# Patient Record
Sex: Male | Born: 1960 | Hispanic: Yes | Marital: Married | Smoking: Never smoker
Health system: Southern US, Community
[De-identification: ages and names within clinical notes are randomized; demographics above are authoritative.]

## PROBLEM LIST (undated history)

## (undated) DIAGNOSIS — I10 Essential (primary) hypertension: Secondary | ICD-10-CM

## (undated) DIAGNOSIS — E119 Type 2 diabetes mellitus without complications: Secondary | ICD-10-CM

## (undated) DIAGNOSIS — E7800 Pure hypercholesterolemia, unspecified: Secondary | ICD-10-CM

## (undated) HISTORY — PX: NO PAST SURGERIES: SHX2092

---

## 2002-12-06 ENCOUNTER — Encounter: Payer: Self-pay | Admitting: *Deleted

## 2002-12-06 ENCOUNTER — Emergency Department (HOSPITAL_COMMUNITY): Admission: EM | Admit: 2002-12-06 | Discharge: 2002-12-06 | Payer: Self-pay | Admitting: *Deleted

## 2008-03-02 ENCOUNTER — Emergency Department (HOSPITAL_COMMUNITY): Admission: EM | Admit: 2008-03-02 | Discharge: 2008-03-02 | Payer: Self-pay | Admitting: Emergency Medicine

## 2011-04-22 LAB — COMPREHENSIVE METABOLIC PANEL
ALT: 41
Alkaline Phosphatase: 78
CO2: 26
Calcium: 8.7
GFR calc non Af Amer: >60
Glucose, Bld: 175 — ABNORMAL HIGH
Sodium: 131 — ABNORMAL LOW
Total Bilirubin: 0.9

## 2011-04-22 LAB — URINE MICROSCOPIC-ADD ON

## 2011-04-22 LAB — CBC
Hemoglobin: 15.5
MCHC: 34.3
RBC: 5.12

## 2011-04-22 LAB — URINALYSIS, ROUTINE W REFLEX MICROSCOPIC
Bilirubin Urine: NEGATIVE
Ketones, ur: NEGATIVE
Nitrite: NEGATIVE
Urobilinogen, UA: 0.2
pH: 6.5

## 2011-04-22 LAB — DIFFERENTIAL
Basophils Absolute: 0
Basophils Relative: 0
Eosinophils Absolute: 0
Neutro Abs: 9.1 — ABNORMAL HIGH
Neutrophils Relative %: 83 — ABNORMAL HIGH

## 2015-03-13 ENCOUNTER — Telehealth: Payer: Self-pay

## 2015-03-13 NOTE — Telephone Encounter (Signed)
Rosa called for patient patient to set him up for his colonoscopy. Please call [phone] and ask for [facility] (interpreter)

## 2015-03-23 NOTE — Telephone Encounter (Signed)
LMOM to call.

## 2015-03-24 ENCOUNTER — Other Ambulatory Visit: Payer: Self-pay

## 2015-03-24 ENCOUNTER — Telehealth: Payer: Self-pay

## 2015-03-24 DIAGNOSIS — Z1211 Encounter for screening for malignant neoplasm of colon: Secondary | ICD-10-CM

## 2015-03-24 NOTE — Telephone Encounter (Signed)
SUPREP SPLIT DOSING-REGULAR breakfast then CLEAR LIQUIDS after 9 am.  CONTINUE GLUCOPHAGE. HOLD lantus ON THE MORNING OF TCS.

## 2015-03-24 NOTE — Telephone Encounter (Signed)
Gastroenterology Pre-Procedure Review  Request Date: 03/24/2015 Requesting Physician: Dwyane Luo, PA  PATIENT REVIEW QUESTIONS: The patient responded to the following health history questions as indicated:    1. Diabetes Melitis: YES 2. Joint replacements in the past 12 months: no 3. Major health problems in the past 3 months: no 4. Has an artificial valve or MVP: no 5. Has a defibrillator: no 6. Has been advised in past to take antibiotics in advance of a procedure like teeth cleaning: no    MEDICATIONS & ALLERGIES:    Patient reports the following regarding taking any blood thinners:   Plavix? no Aspirin? YES Coumadin? no  Patient confirms/reports the following medications:  Current Outpatient Prescriptions  Medication Sig Dispense Refill  . aspirin 81 MG tablet Take 81 mg by mouth daily.    . insulin glargine (LANTUS) 100 UNIT/ML injection Inject 15 Units into the skin daily. Takes in the AM    . metFORMIN (GLUMETZA) 500 MG (MOD) 24 hr tablet Take 500 mg by mouth at bedtime.     No current facility-administered medications for this visit.    Patient confirms/reports the following allergies:  No Known Allergies  No orders of the defined types were placed in this encounter.    AUTHORIZATION INFORMATION Primary Insurance:   ID #:  Group #:  Pre-Cert / Auth required: Pre-Cert / Auth #:   Secondary Insurance:   ID #:   Group #:  Pre-Cert / Auth required:  Pre-Cert / Auth #:   SCHEDULE INFORMATION: Procedure has been scheduled as follows:  Date: 04/24/2015                  Time: 10:30 AM  Location: [facility] Short Stay  This Gastroenterology Pre-Precedure Review Form is being routed to the following provider(s): Jonette Eva, MD

## 2015-03-24 NOTE — Telephone Encounter (Signed)
See separate triage.  

## 2015-03-25 MED ORDER — NA SULFATE-K SULFATE-MG SULF 17.5-3.13-1.6 GM/177ML PO SOLN: 1.0000 | ORAL | Status: DC

## 2015-03-25 NOTE — Telephone Encounter (Signed)
Rx sent to the pharmacy and instructions mailed to pt.  Instructions for the lantus and glucophage handwritten on the instruction sheet.  On morning of prep hold lantus and continue glucophage.

## 2015-04-06 ENCOUNTER — Telehealth: Payer: Self-pay

## 2015-04-06 NOTE — Telephone Encounter (Signed)
T/C from [place], daughter, to reschedule colonoscopy from 04/24/2015 to 04/27/2015 at 10:15 AM.  I am mailing new instructions. LMOM for Timothy Townsend of the new appt.

## 2015-04-06 NOTE — Telephone Encounter (Signed)
I called [employer] @ [phone] and spoke to Onnie Graham who said a PA is not required for a screening colonoscopy as out patient at [facility].

## 2015-04-22 ENCOUNTER — Telehealth: Payer: Self-pay

## 2015-04-22 NOTE — Telephone Encounter (Signed)
PLEASE CALL PATIENT DAUGHTER [phone]   HAS QUESTION ABOUT PREP KIT

## 2015-04-22 NOTE — Telephone Encounter (Signed)
I spoke to daughter, Clotilde Dieter. She asked for a sample of the Suprep, it would cost them $98.00.  I am leaving a sample at the front for pick up with the instructions also.

## 2015-04-22 NOTE — Telephone Encounter (Signed)
LMOM to call.

## 2015-04-27 ENCOUNTER — Ambulatory Visit (HOSPITAL_COMMUNITY)
Admission: RE | Admit: 2015-04-27 | Discharge: 2015-04-27 | Disposition: A | Discharge status: Home / Self Care | Payer: BLUE CROSS/BLUE SHIELD | Source: Ambulatory Visit | Attending: Gastroenterology | Admitting: Gastroenterology

## 2015-04-27 ENCOUNTER — Encounter (HOSPITAL_COMMUNITY): Payer: Self-pay | Admitting: *Deleted

## 2015-04-27 ENCOUNTER — Encounter (HOSPITAL_COMMUNITY)
Admission: RE | Disposition: A | Discharge status: Home / Self Care | Payer: Self-pay | Source: Ambulatory Visit | Attending: Gastroenterology

## 2015-04-27 DIAGNOSIS — Q438 Other specified congenital malformations of intestine: Secondary | ICD-10-CM | POA: Insufficient documentation

## 2015-04-27 DIAGNOSIS — K644 Residual hemorrhoidal skin tags: Secondary | ICD-10-CM | POA: Diagnosis not present

## 2015-04-27 DIAGNOSIS — Z7984 Long term (current) use of oral hypoglycemic drugs: Secondary | ICD-10-CM | POA: Insufficient documentation

## 2015-04-27 DIAGNOSIS — E119 Type 2 diabetes mellitus without complications: Secondary | ICD-10-CM | POA: Diagnosis not present

## 2015-04-27 DIAGNOSIS — Z1211 Encounter for screening for malignant neoplasm of colon: Secondary | ICD-10-CM | POA: Diagnosis not present

## 2015-04-27 DIAGNOSIS — K648 Other hemorrhoids: Secondary | ICD-10-CM | POA: Insufficient documentation

## 2015-04-27 DIAGNOSIS — K5730 Diverticulosis of large intestine without perforation or abscess without bleeding: Secondary | ICD-10-CM | POA: Insufficient documentation

## 2015-04-27 DIAGNOSIS — Z794 Long term (current) use of insulin: Secondary | ICD-10-CM | POA: Diagnosis not present

## 2015-04-27 DIAGNOSIS — K621 Rectal polyp: Secondary | ICD-10-CM | POA: Diagnosis not present

## 2015-04-27 DIAGNOSIS — Z7982 Long term (current) use of aspirin: Secondary | ICD-10-CM | POA: Insufficient documentation

## 2015-04-27 HISTORY — DX: Type 2 diabetes mellitus without complications: E11.9

## 2015-04-27 HISTORY — PX: COLONOSCOPY: SHX5424

## 2015-04-27 LAB — GLUCOSE, CAPILLARY: Glucose-Capillary: 169 mg/dL — ABNORMAL HIGH (ref 65–99)

## 2015-04-27 SURGERY — COLONOSCOPY
Anesthesia: Moderate Sedation

## 2015-04-27 MED ORDER — SODIUM CHLORIDE 0.9 % IV SOLN
INTRAVENOUS | Status: DC
  Administered 2015-04-27: 10:00:00 via INTRAVENOUS

## 2015-04-27 MED ORDER — MEPERIDINE HCL 100 MG/ML IJ SOLN
INTRAMUSCULAR | Status: AC
  Filled 2015-04-27: qty 2

## 2015-04-27 MED ORDER — MIDAZOLAM HCL 5 MG/5ML IJ SOLN
INTRAMUSCULAR | Status: DC | PRN
  Administered 2015-04-27 (×2): 2 mg via INTRAVENOUS
  Administered 2015-04-27: 1 mg via INTRAVENOUS

## 2015-04-27 MED ORDER — MEPERIDINE HCL 100 MG/ML IJ SOLN
INTRAMUSCULAR | Status: DC | PRN
  Administered 2015-04-27 (×3): 25 mg via INTRAVENOUS

## 2015-04-27 MED ORDER — MIDAZOLAM HCL 5 MG/5ML IJ SOLN
INTRAMUSCULAR | Status: AC
  Filled 2015-04-27: qty 10

## 2015-04-27 NOTE — Discharge Instructions (Signed)
You have internal AND EXTERNAL hemorrhoids AND DIVERTICULOSIS IN YOUR LEFT COLON. YOU DID NOT HAVE ANY POLYPS. I BIOPSIED A NODULE IN YOUR RECTUM.   FOLLOW A Dieta con alto contenido de [place]. AVOID ITEMS THAT CAUSE BLOATING. SEE INFO BELOW.  Next Colonoscopa in 10 anos.  Colonoscopa: cuidados posteriores (Colonoscopy, Care After) Estas indicaciones le proporcionan informacin general acerca de cmo deber cuidarse despus del procedimiento. El mdico tambin podr darle instrucciones especficas. Comunquese con el mdico si tiene algn problema o tiene preguntas despus del procedimiento. CUIDADOS EN EL HOGAR  No conduzca durante 24horas.  No firme papeles importantes ni use maquinaria pesada durante 24horas.  Puede ducharse.  Puede retomar las actividades habituales, pero hgalo ms despacio durante las primeras 24horas.  Durante las primeras 24horas, descanse con frecuencia.  Camine o pngase compresas tibias en el vientre (abdomen) si tiene clicos intestinales o gases.  Beba suficiente lquido para mantener el pis (orina) claro o de color amarillo plido.  Retome su dieta normal. No coma comidas pesadas ni fritas.  No tome alcohol durante 24horas, o segn el mdico le indique.  Tome solo los medicamentos segn le haya indicado el mdico. Si se obtuvo una muestra de tejido (biopsia) durante el procedimiento:   No tome aspirina ni anticoagulantes durante 7das, o segn el mdico le indique.  No tome alcohol durante 7das, o segn el mdico le indique.  Consuma alimentos livianos durante las primeras 24horas. SOLICITE AYUDA SI: An hay una pequea cantidad de sangre en la materia fecal (heces) 2 o 3das despus del procedimiento. SOLICITE AYUDA DE INMEDIATO SI:  Hay ms que una pequea cantidad de [employer] materia fecal.  Observa grumos de tejido (cogulos de [place]) en la materia fecal.  Tiene el vientre inflamado (hinchado).  Tiene malestar  estomacal (nuseas) o vomita.  Tiene fiebre.  Siente que Chief Technology Officer en el vientre empeora y no se alivia con los medicamentos.   Dieta con alto contenido de fibra  (High [employer] Diet)  La fibra se encuentra en frutas, verduras y granos. Una dieta con alto contenido en fibras se favorece con la adicin de ms granos enteros, legumbres, frutas y verduras en su dieta. La cantidad recomendada de fibra para los hombres adultos es de 38 g por da. Para las mujeres adultas es de 25 g por da. Las [employer] y las que amamantan deben consumir 28 gramos de fibra por Futures trader. Si usted tiene un problema digestivo o intestinal, consulte a su mdico antes de la adicin de alimentos ricos en fibra a su dieta. Coma una variedad de alimentos ricos en fibra en lugar de slo unos pocos.  OBJETIVO  Aumentar la masa fecal.  Tener deposiciones ms regulares para evitar el estreimiento.  Reducir el colesterol.  Para evitar comer en exceso. CUANDO SE UTILIZA ESTA DIETA?  En caso de estreimiento y hemorroides.  En caso de diverticulosis no complicada (enfermedad intestinal) y en el sndrome del colon irritable.  Si necesita ayuda para el control de [place].  Si desea mejorar su dieta como medida de proteccin contra la aterosclerosis, la diabetes y Management consultant. FUENTES DE FIBRA  Panes y cereales integrales.  Frutas, como las [place], [place], pltanos, fresas, Environmental manager y peras.  Verduras, como guisantes, zanahorias, batatas, remolachas, brcoli, repollo, espinacas y alcauciles.  Legumbres, las arvejas, soja, lentejas.  Almendras. CONTENIDO DE FIBRA DE LOS ALIMENTOS  Almidones y granos / Fibra Diettica (g)  Cheerios, 1 taza / 3 g  Corn Flakes, 1 taza /  0,7 g  Arroz inflado, 1  tazas / 0,3 g  Harina de avena instantnea (cocida),  taza / 2 g  Cereal de trigo escarchado, 1 taza / 5,1 g  Arroz marrn grano largo (cocido), 1 taza / 3,5 g  Arroz blanco grano largo (cocido), 1 taza / 0,6 g  Macarrones  enriquecidos (cocidos), 1 taza / 2,5 g Legumbres / Fibra Diettica (g)  Frijoles cocidos (enlatados, crudos o vegetarianos),  taza / 5,2 g  Frijoles (enlatados),  taza / 6,8 g  Frijoles pintos (cocidos),  taza / 5,5 g Panes y Gaffer / Media planner (g)  Galletas de graham o miel, 2 plazas / 0,7 g  Galletitas saladas, 3 unidades / 0,3 g  Pretzels salados comunes, 10 pedazos / 1,8 g  Pan integral, 1 rebanada / 1,9 g  Pan blanco, 1 rebanada / 0,7 g  Pan con pasas, 1 rebanada / 1,2 g  Bagel 3 oz / 2 g  Tortilla de harina, 1 oz / 0.9 g  Tortilla de maz, 1 pequea / 1,5 g  Pan de amburguesa o hot dog, 1 pequeo / 0,9 g Frutas / Fibra Diettica (g)  Manzana con piel, 1 mediana / 4,4 g  Pur de [employer],  taza / 1,5 g  Pltano,  mediano / 1,5 g  Uvas, 10 uvas / 0,4 g  Naranja, 1 pequea / 2,3 g  Pasas, 1,5 oz / 1.6 g  Meln, 1 taza / 1,4 g Vegetales / Fibra Diettica (g)  Judas verdes (en conserva),  taza / 1,3 g  Zanahorias (cocido),  taza / 2,3 g  Broccoli (cocido),  taza / 2,8 g  Guisantes (cocidos),  taza / 4,4 g  Pur de papas,  taza / 1,6 g  Lechuga, 1 taza / 0,5 g  Maz (en lata),  taza / 1,6 g  Tomate,  taza / 1,1 g 1 cup / 3 g.

## 2015-04-27 NOTE — H&P (Signed)
  Primary Care Physician:  Purvis Kilts, MD Primary Gastroenterologist:  Dr. Oneida Alar  Pre-Procedure History & Physical: HPI:  Timothy Townsend is a 54 y.o. male here for [place].  Past Medical History  Diagnosis Date  . Diabetes mellitus without complication [facility])     Past Surgical History  Procedure Laterality Date  . No past surgeries      Prior to Admission medications   Medication Sig Start Date End Date Taking? Authorizing Provider  aspirin 81 MG tablet Take 81 mg by mouth daily.   Yes Historical Provider, MD  insulin glargine (LANTUS) 100 UNIT/ML injection Inject 15 Units into the skin daily. Takes in the AM   Yes Historical Provider, MD  metFORMIN (GLUMETZA) 500 MG (MOD) 24 hr tablet Take 500 mg by mouth at bedtime.   Yes Historical Provider, MD  Na Sulfate-K Sulfate-Mg Sulf (SUPREP BOWEL PREP) SOLN Take 1 kit by mouth as directed. 03/25/15  Yes Danie Binder, MD    Allergies as of 03/24/2015  . (No Known Allergies)    History reviewed. No pertinent family history.  Social History   Social History  . Marital Status: Married    Spouse Name: N/A  . Number of Children: N/A  . Years of Education: N/A   Occupational History  . Not on file.   Social History Main Topics  . Smoking status: Never Smoker   . Smokeless tobacco: Not on file  . Alcohol Use: No  . Drug Use: No  . Sexual Activity: Not on file   Other Topics Concern  . Not on file   Social History Narrative  . No narrative on file    Review of Systems: See HPI, otherwise negative ROS   Physical Exam: BP 125/78 mmHg  Pulse 70  Temp(Src) 98.6 F (37 C) (Oral)  Resp 22  Ht _0  (1.702 m)  Wt 217 lb (98.431 kg)  BMI 33.98 kg/m2  SpO2 98% General:   Alert,  pleasant and cooperative in NAD Head:  Normocephalic and atraumatic. Neck:  Supple; Lungs:  Clear throughout to auscultation.    Heart:  Regular rate and rhythm. Abdomen:  Soft, nontender and nondistended. Normal bowel  sounds, without guarding, and without rebound.   Neurologic:  Alert and  oriented x4;  grossly normal neurologically.  Impression/Plan:     SCREENING  Plan:  1. TCS TODAY

## 2015-04-27 NOTE — Op Note (Signed)
[facility] [address] [place], [address]   COLONOSCOPY PROCEDURE REPORT  PATIENT: Timothy Townsend, Timothy Townsend  MR#: [id number] BIRTHDATE: 08-26-1960 , 54  yrs. old GENDER: male ENDOSCOPIST: West Bali, MD REFERRED XB:JYNW Phillips Odor, M.D. PROCEDURE DATE:  2015-04-29 PROCEDURE:   Colonoscopy with biopsy INDICATIONS:average risk patient for colon cancer. MEDICATIONS: Demerol 75 mg IV and Versed 5 mg IV  DESCRIPTION OF PROCEDURE:    Physical exam was performed.  Informed consent was obtained from the patient after explaining the benefits, risks, and alternatives to procedure.  The patient was connected to monitor and placed in left lateral position. Continuous oxygen was provided by nasal cannula and IV medicine administered through an indwelling cannula.  After administration of sedation and rectal exam, the patients rectum was intubated and the EC-[id number] ([id number])  colonoscope was advanced under direct visualization to the ileum.  The scope was removed slowly by carefully examining the color, texture, anatomy, and integrity mucosa on the way out.  The patient was recovered in endoscopy and discharged home in satisfactory condition. Estimated blood loss is zero unless otherwise noted in this procedure report.    COLON FINDINGS: The examined terminal ileum appeared to be normal. , There was moderate diverticulosis noted in the sigmoid colon and descending colon with associated muscular hypertrophy and tortuosity.  , Small internal hemorrhoids were found.  , Moderate sized external hemorrhoids were found.  , and YELLOW NODULE IN RECTUM BIOPSIED VIA COLD FORCEPS.  PREP QUALITY: excellent.  CECAL W/D TIME: 16       minutes COMPLICATIONS: None  ENDOSCOPIC IMPRESSION: 1.   RECTAL NODULE MOST LIKEY A LIPOMA 2.   Moderate diverticulosis in the sigmoid colon and descending colon 3.   Small internal hemorrhoids 4.   Moderate sized external hemorrhoids 5.   SLIGHTLY  REDUNDANT LEFT COLON  RECOMMENDATIONS: AWAIT BIOPSY HIGH FIBET DIET NEXT TCS IN 10 YEARS    _______________________________ eSignedWest Bali, MD 04/29/2015 11:23 AM   CPT CODES: ICD CODES:  The ICD and CPT codes recommended by this software are interpretations from the data that the clinical staff has captured with the software.  The verification of the translation of this report to the ICD and CPT codes and modifiers is the sole responsibility of the health care institution and practicing physician where this report was generated.  [employer] will not be held responsible for the validity of the ICD and CPT codes included on this report.  AMA assumes no liability for data contained or not contained herein. CPT is a Publishing rights manager of the [employer].

## 2015-04-29 ENCOUNTER — Telehealth: Payer: Self-pay | Admitting: Gastroenterology

## 2015-04-29 ENCOUNTER — Encounter (HOSPITAL_COMMUNITY): Payer: Self-pay | Admitting: Gastroenterology

## 2015-04-29 ENCOUNTER — Other Ambulatory Visit: Payer: Self-pay

## 2015-04-29 DIAGNOSIS — K6289 Other specified diseases of anus and rectum: Secondary | ICD-10-CM

## 2015-04-29 NOTE — Telephone Encounter (Signed)
ON RECALL  °

## 2015-04-29 NOTE — Telephone Encounter (Signed)
Called pt and was unable to reach him on his number.LMOM of patients daughter Timothy Townsend. Told her to call office back for some results

## 2015-04-29 NOTE — Telephone Encounter (Signed)
PLEASE CALL PT. HE HAS A SUBMUCOSAL LESION IN HIS RECTUM. THE LINING OF THE RECTUM LOOKS NORMAL BUT HE NEEDS A RECTAL U/S TO MAKE SURE IT IS BENIGN UNDERNEATH. WE WILL REFER HIM TO DR. Dulce Sellar FOR RECTAL ULTRASOUND, Dx: RECTAL NODULE EVALUATE FOR CARCINOID. FOLLOW A HIGH FIBER DIET. NEXT TCS IN 10 YEARS.

## 2015-04-30 NOTE — Telephone Encounter (Signed)
Rosa pt daughter called back and she is aware of the referral  To Dr. Dulce Sellar

## 2015-06-17 ENCOUNTER — Other Ambulatory Visit: Payer: Self-pay | Admitting: Gastroenterology

## 2015-06-22 ENCOUNTER — Other Ambulatory Visit: Payer: Self-pay | Admitting: Gastroenterology

## 2015-06-22 NOTE — Addendum Note (Signed)
Addended by: Willis ModenaUTLAW, Anastasios Melander on: 06/22/2015 08:03 AM   Modules accepted: Orders

## 2015-06-22 NOTE — Addendum Note (Signed)
Addended by: Arletha Marschke on: 06/22/2015 10:06 AM   Modules accepted: Orders  

## 2015-06-24 ENCOUNTER — Encounter (HOSPITAL_COMMUNITY): Admission: RE | Payer: Self-pay | Source: Ambulatory Visit

## 2015-06-24 ENCOUNTER — Ambulatory Visit (HOSPITAL_COMMUNITY)
Admission: RE | Admit: 2015-06-24 | Payer: BLUE CROSS/BLUE SHIELD | Source: Ambulatory Visit | Admitting: Gastroenterology

## 2015-06-24 SURGERY — ULTRASOUND, LOWER GI TRACT, ENDOSCOPIC
Anesthesia: Moderate Sedation

## 2016-03-25 DIAGNOSIS — Z6838 Body mass index (BMI) 38.0-38.9, adult: Secondary | ICD-10-CM | POA: Diagnosis not present

## 2016-03-25 DIAGNOSIS — E782 Mixed hyperlipidemia: Secondary | ICD-10-CM | POA: Diagnosis not present

## 2016-03-25 DIAGNOSIS — Z1389 Encounter for screening for other disorder: Secondary | ICD-10-CM | POA: Diagnosis not present

## 2016-03-25 DIAGNOSIS — R7989 Other specified abnormal findings of blood chemistry: Secondary | ICD-10-CM | POA: Diagnosis not present

## 2016-03-25 DIAGNOSIS — E1165 Type 2 diabetes mellitus with hyperglycemia: Secondary | ICD-10-CM | POA: Diagnosis not present

## 2016-03-25 DIAGNOSIS — I10 Essential (primary) hypertension: Secondary | ICD-10-CM | POA: Diagnosis not present

## 2016-07-08 DIAGNOSIS — E1165 Type 2 diabetes mellitus with hyperglycemia: Secondary | ICD-10-CM | POA: Diagnosis not present

## 2016-07-08 DIAGNOSIS — Z1389 Encounter for screening for other disorder: Secondary | ICD-10-CM | POA: Diagnosis not present

## 2016-07-08 DIAGNOSIS — I10 Essential (primary) hypertension: Secondary | ICD-10-CM | POA: Diagnosis not present

## 2016-07-08 DIAGNOSIS — E782 Mixed hyperlipidemia: Secondary | ICD-10-CM | POA: Diagnosis not present

## 2016-07-08 DIAGNOSIS — Z6839 Body mass index (BMI) 39.0-39.9, adult: Secondary | ICD-10-CM | POA: Diagnosis not present

## 2016-07-08 DIAGNOSIS — R7989 Other specified abnormal findings of blood chemistry: Secondary | ICD-10-CM | POA: Diagnosis not present

## 2016-07-08 DIAGNOSIS — Z23 Encounter for immunization: Secondary | ICD-10-CM | POA: Diagnosis not present

## 2016-08-16 DIAGNOSIS — R7989 Other specified abnormal findings of blood chemistry: Secondary | ICD-10-CM | POA: Diagnosis not present

## 2016-08-26 DIAGNOSIS — H35032 Hypertensive retinopathy, left eye: Secondary | ICD-10-CM | POA: Diagnosis not present

## 2016-08-26 DIAGNOSIS — H35031 Hypertensive retinopathy, right eye: Secondary | ICD-10-CM | POA: Diagnosis not present

## 2016-08-26 DIAGNOSIS — H2513 Age-related nuclear cataract, bilateral: Secondary | ICD-10-CM | POA: Diagnosis not present

## 2016-08-26 DIAGNOSIS — E119 Type 2 diabetes mellitus without complications: Secondary | ICD-10-CM | POA: Diagnosis not present

## 2016-08-26 DIAGNOSIS — H35033 Hypertensive retinopathy, bilateral: Secondary | ICD-10-CM | POA: Diagnosis not present

## 2016-08-26 DIAGNOSIS — H25013 Cortical age-related cataract, bilateral: Secondary | ICD-10-CM | POA: Diagnosis not present

## 2017-01-13 DIAGNOSIS — Z23 Encounter for immunization: Secondary | ICD-10-CM | POA: Diagnosis not present

## 2017-01-13 DIAGNOSIS — Z6837 Body mass index (BMI) 37.0-37.9, adult: Secondary | ICD-10-CM | POA: Diagnosis not present

## 2017-01-13 DIAGNOSIS — R7989 Other specified abnormal findings of blood chemistry: Secondary | ICD-10-CM | POA: Diagnosis not present

## 2017-01-13 DIAGNOSIS — G4730 Sleep apnea, unspecified: Secondary | ICD-10-CM | POA: Diagnosis not present

## 2017-01-13 DIAGNOSIS — E1165 Type 2 diabetes mellitus with hyperglycemia: Secondary | ICD-10-CM | POA: Diagnosis not present

## 2017-01-13 DIAGNOSIS — Z1389 Encounter for screening for other disorder: Secondary | ICD-10-CM | POA: Diagnosis not present

## 2017-01-13 DIAGNOSIS — I10 Essential (primary) hypertension: Secondary | ICD-10-CM | POA: Diagnosis not present

## 2017-01-13 DIAGNOSIS — E782 Mixed hyperlipidemia: Secondary | ICD-10-CM | POA: Diagnosis not present

## 2017-02-10 DIAGNOSIS — R7989 Other specified abnormal findings of blood chemistry: Secondary | ICD-10-CM | POA: Diagnosis not present

## 2017-03-17 DIAGNOSIS — R7989 Other specified abnormal findings of blood chemistry: Secondary | ICD-10-CM | POA: Diagnosis not present

## 2017-05-19 DIAGNOSIS — I10 Essential (primary) hypertension: Secondary | ICD-10-CM | POA: Diagnosis not present

## 2017-05-19 DIAGNOSIS — Z23 Encounter for immunization: Secondary | ICD-10-CM | POA: Diagnosis not present

## 2017-05-19 DIAGNOSIS — Z6838 Body mass index (BMI) 38.0-38.9, adult: Secondary | ICD-10-CM | POA: Diagnosis not present

## 2017-05-19 DIAGNOSIS — E782 Mixed hyperlipidemia: Secondary | ICD-10-CM | POA: Diagnosis not present

## 2017-05-19 DIAGNOSIS — G4730 Sleep apnea, unspecified: Secondary | ICD-10-CM | POA: Diagnosis not present

## 2017-05-19 DIAGNOSIS — R7989 Other specified abnormal findings of blood chemistry: Secondary | ICD-10-CM | POA: Diagnosis not present

## 2017-05-19 DIAGNOSIS — E1165 Type 2 diabetes mellitus with hyperglycemia: Secondary | ICD-10-CM | POA: Diagnosis not present

## 2017-06-19 DIAGNOSIS — R7989 Other specified abnormal findings of blood chemistry: Secondary | ICD-10-CM | POA: Diagnosis not present

## 2017-07-26 DIAGNOSIS — R7989 Other specified abnormal findings of blood chemistry: Secondary | ICD-10-CM | POA: Diagnosis not present

## 2017-08-22 DIAGNOSIS — R7989 Other specified abnormal findings of blood chemistry: Secondary | ICD-10-CM | POA: Diagnosis not present

## 2017-08-25 DIAGNOSIS — I10 Essential (primary) hypertension: Secondary | ICD-10-CM | POA: Diagnosis not present

## 2017-08-25 DIAGNOSIS — Z6839 Body mass index (BMI) 39.0-39.9, adult: Secondary | ICD-10-CM | POA: Diagnosis not present

## 2017-08-25 DIAGNOSIS — E1165 Type 2 diabetes mellitus with hyperglycemia: Secondary | ICD-10-CM | POA: Diagnosis not present

## 2017-08-25 DIAGNOSIS — Z1389 Encounter for screening for other disorder: Secondary | ICD-10-CM | POA: Diagnosis not present

## 2017-08-25 DIAGNOSIS — E782 Mixed hyperlipidemia: Secondary | ICD-10-CM | POA: Diagnosis not present

## 2017-08-25 DIAGNOSIS — G4730 Sleep apnea, unspecified: Secondary | ICD-10-CM | POA: Diagnosis not present

## 2017-09-22 DIAGNOSIS — R7989 Other specified abnormal findings of blood chemistry: Secondary | ICD-10-CM | POA: Diagnosis not present

## 2017-10-25 DIAGNOSIS — R7989 Other specified abnormal findings of blood chemistry: Secondary | ICD-10-CM | POA: Diagnosis not present

## 2018-01-05 DIAGNOSIS — G4730 Sleep apnea, unspecified: Secondary | ICD-10-CM | POA: Diagnosis not present

## 2018-01-05 DIAGNOSIS — I10 Essential (primary) hypertension: Secondary | ICD-10-CM | POA: Diagnosis not present

## 2018-01-05 DIAGNOSIS — Z6838 Body mass index (BMI) 38.0-38.9, adult: Secondary | ICD-10-CM | POA: Diagnosis not present

## 2018-01-05 DIAGNOSIS — E782 Mixed hyperlipidemia: Secondary | ICD-10-CM | POA: Diagnosis not present

## 2018-01-05 DIAGNOSIS — R7989 Other specified abnormal findings of blood chemistry: Secondary | ICD-10-CM | POA: Diagnosis not present

## 2018-01-05 DIAGNOSIS — Z1389 Encounter for screening for other disorder: Secondary | ICD-10-CM | POA: Diagnosis not present

## 2018-01-05 DIAGNOSIS — E1165 Type 2 diabetes mellitus with hyperglycemia: Secondary | ICD-10-CM | POA: Diagnosis not present

## 2018-02-05 DIAGNOSIS — R7989 Other specified abnormal findings of blood chemistry: Secondary | ICD-10-CM | POA: Diagnosis not present

## 2018-03-08 DIAGNOSIS — R7989 Other specified abnormal findings of blood chemistry: Secondary | ICD-10-CM | POA: Diagnosis not present

## 2018-04-09 DIAGNOSIS — R7989 Other specified abnormal findings of blood chemistry: Secondary | ICD-10-CM | POA: Diagnosis not present

## 2018-05-15 DIAGNOSIS — R7989 Other specified abnormal findings of blood chemistry: Secondary | ICD-10-CM | POA: Diagnosis not present

## 2018-07-06 DIAGNOSIS — Z6839 Body mass index (BMI) 39.0-39.9, adult: Secondary | ICD-10-CM | POA: Diagnosis not present

## 2018-07-06 DIAGNOSIS — G4730 Sleep apnea, unspecified: Secondary | ICD-10-CM | POA: Diagnosis not present

## 2018-07-06 DIAGNOSIS — E782 Mixed hyperlipidemia: Secondary | ICD-10-CM | POA: Diagnosis not present

## 2018-07-06 DIAGNOSIS — Z1389 Encounter for screening for other disorder: Secondary | ICD-10-CM | POA: Diagnosis not present

## 2018-07-06 DIAGNOSIS — I10 Essential (primary) hypertension: Secondary | ICD-10-CM | POA: Diagnosis not present

## 2018-07-06 DIAGNOSIS — E1165 Type 2 diabetes mellitus with hyperglycemia: Secondary | ICD-10-CM | POA: Diagnosis not present

## 2018-07-06 DIAGNOSIS — R7989 Other specified abnormal findings of blood chemistry: Secondary | ICD-10-CM | POA: Diagnosis not present

## 2018-07-27 DIAGNOSIS — Z7984 Long term (current) use of oral hypoglycemic drugs: Secondary | ICD-10-CM | POA: Diagnosis not present

## 2018-07-27 DIAGNOSIS — I10 Essential (primary) hypertension: Secondary | ICD-10-CM | POA: Diagnosis not present

## 2018-07-27 DIAGNOSIS — H8110 Benign paroxysmal vertigo, unspecified ear: Secondary | ICD-10-CM | POA: Diagnosis not present

## 2018-07-27 DIAGNOSIS — G9389 Other specified disorders of brain: Secondary | ICD-10-CM | POA: Diagnosis not present

## 2018-07-27 DIAGNOSIS — R42 Dizziness and giddiness: Secondary | ICD-10-CM | POA: Diagnosis not present

## 2018-07-27 DIAGNOSIS — E7800 Pure hypercholesterolemia, unspecified: Secondary | ICD-10-CM | POA: Diagnosis not present

## 2018-07-27 DIAGNOSIS — E119 Type 2 diabetes mellitus without complications: Secondary | ICD-10-CM | POA: Diagnosis not present

## 2018-08-03 DIAGNOSIS — Z1389 Encounter for screening for other disorder: Secondary | ICD-10-CM | POA: Diagnosis not present

## 2018-08-03 DIAGNOSIS — Z6838 Body mass index (BMI) 38.0-38.9, adult: Secondary | ICD-10-CM | POA: Diagnosis not present

## 2018-08-03 DIAGNOSIS — E1165 Type 2 diabetes mellitus with hyperglycemia: Secondary | ICD-10-CM | POA: Diagnosis not present

## 2018-08-03 DIAGNOSIS — E782 Mixed hyperlipidemia: Secondary | ICD-10-CM | POA: Diagnosis not present

## 2018-08-03 DIAGNOSIS — I10 Essential (primary) hypertension: Secondary | ICD-10-CM | POA: Diagnosis not present

## 2018-09-20 DIAGNOSIS — R7989 Other specified abnormal findings of blood chemistry: Secondary | ICD-10-CM | POA: Diagnosis not present

## 2018-10-05 DIAGNOSIS — R7989 Other specified abnormal findings of blood chemistry: Secondary | ICD-10-CM | POA: Diagnosis not present

## 2018-10-05 DIAGNOSIS — Z6837 Body mass index (BMI) 37.0-37.9, adult: Secondary | ICD-10-CM | POA: Diagnosis not present

## 2018-10-05 DIAGNOSIS — Z1389 Encounter for screening for other disorder: Secondary | ICD-10-CM | POA: Diagnosis not present

## 2018-10-05 DIAGNOSIS — E119 Type 2 diabetes mellitus without complications: Secondary | ICD-10-CM | POA: Diagnosis not present

## 2018-10-05 DIAGNOSIS — K5900 Constipation, unspecified: Secondary | ICD-10-CM | POA: Diagnosis not present

## 2018-10-05 DIAGNOSIS — Z0001 Encounter for general adult medical examination with abnormal findings: Secondary | ICD-10-CM | POA: Diagnosis not present

## 2019-01-18 DIAGNOSIS — I10 Essential (primary) hypertension: Secondary | ICD-10-CM | POA: Diagnosis not present

## 2019-01-18 DIAGNOSIS — E118 Type 2 diabetes mellitus with unspecified complications: Secondary | ICD-10-CM | POA: Diagnosis not present

## 2019-01-18 DIAGNOSIS — Z6837 Body mass index (BMI) 37.0-37.9, adult: Secondary | ICD-10-CM | POA: Diagnosis not present

## 2019-01-18 DIAGNOSIS — R7989 Other specified abnormal findings of blood chemistry: Secondary | ICD-10-CM | POA: Diagnosis not present

## 2019-01-18 DIAGNOSIS — E7849 Other hyperlipidemia: Secondary | ICD-10-CM | POA: Diagnosis not present

## 2019-01-18 DIAGNOSIS — Z1389 Encounter for screening for other disorder: Secondary | ICD-10-CM | POA: Diagnosis not present

## 2019-01-18 DIAGNOSIS — L255 Unspecified contact dermatitis due to plants, except food: Secondary | ICD-10-CM | POA: Diagnosis not present

## 2019-02-12 ENCOUNTER — Ambulatory Visit: Payer: BC Managed Care – PPO | Admitting: General Surgery

## 2019-03-14 ENCOUNTER — Ambulatory Visit: Payer: BC Managed Care – PPO | Admitting: General Surgery

## 2019-03-14 ENCOUNTER — Other Ambulatory Visit: Payer: Self-pay

## 2019-03-14 ENCOUNTER — Encounter: Payer: Self-pay | Admitting: General Surgery

## 2019-03-14 VITALS — BP 142/83 | HR 71 | Temp 97.7°F | Resp 16 | Ht 66.0 in | Wt 223.0 lb

## 2019-03-14 DIAGNOSIS — L290 Pruritus ani: Secondary | ICD-10-CM | POA: Diagnosis not present

## 2019-03-14 NOTE — Patient Instructions (Signed)
Drenaje de quiste pilonidal, cuidados posteriores Pilonidal Cyst Drainage, Care After Esta hoja le brinda informacin sobre cmo cuidarse despus del procedimiento. El mdico tambin podr darle indicaciones ms especficas. Comunquese con el mdico si tiene problemas o preguntas. Qu puedo esperar despus del procedimiento? Despus del procedimiento, es comn DIRECTVtener los siguientes sntomas:  Dolor que mejora al tomar United Parcelmedicamentos.  Algo de lquido o sangre que salen de la herida. Siga estas indicaciones en su casa: Medicamentos  Baxter Internationalome los medicamentos de venta libre y los recetados solamente como se lo haya indicado el mdico.  Si le recetaron un antibitico, tmelo como se lo haya indicado el mdico. No deje de tomar el antibitico aunque comience a sentirse mejor. Estilo de vida  No realice actividades que irriten o ejerzan presin en las nalgas durante unas 2 semanas o como se lo haya indicado el mdico. Estas actividades incluyen andar en bicicleta, correr y cualquier actividad que involucre un movimiento de torsin.  No permanezca sentado durante mucho tiempo sin ponerse de pie y Richwoodmoverse.  Duerma de costado en vez de boca arriba.  Evite usar ropa interior Indonesiaajustada y pantalones ajustados. Baos  No tome baos de inmersin, no se duche, no nade ni use el jacuzzi hasta que el mdico lo autorice. Esto depende del tipo de herida que tenga, consecuencia de la Azerbaijanciruga.  Al baarse, lvese suavemente la zona de las nalgas con agua y Belarusjabn.  Despus del bao: ? Seque bien la zona con una toalla limpia y Florissuave, dando golpecitos. ? Cubra la zona con una venda limpia (vendaje), si se lo indica el mdico. Indicaciones generales   Si toma analgsicos recetados, adopte medidas para prevenir o tratar el estreimiento. El mdico podra recomendarle que haga lo siguiente: ? Beber suficiente lquido como para mantener la orina de color amarillo plido. ? Consumir alimentos ricos en fibra,  como frutas y verduras frescas, cereales integrales y frijoles. ? Limitar el consumo de alimentos ricos en grasa y azcares procesados, como los alimentos fritos o dulces. ? Tomar un medicamento recetado o de venta libre para el estreimiento.  Deber tener un cuidador que lo ayude a Company secretarymanejar el cuidado de la herida y el cambio de vendaje. El cuidador debe: ? Lavarse las manos con agua y [place]jabn antes de cambiarle el vendaje. Usar un desinfectante para manos si no dispone de Franceagua y [place]jabn. ? Controlar la herida CarMaxtodos los das para detectar signos de infeccin tales como:  Enrojecimiento, hinchazn o ms dolor.  Mayor presencia de lquido o Petersburgsangre.  Calor.  Pus o mal olor. ? Siga las instrucciones adicionales de su mdico sobre el cuidado de la herida, como limpieza de la herida, lavado de la herida (irrigacin) o taponar la herida con un vendaje.  Concurra a todas las visitas de control como se lo haya indicado el mdico. Esto es importante. Si tuvo una incisin y drenaje con taponamiento de la herida:  Regrese al mdico como se le indic para que le cambien o le quiten Animatorel material de taponamiento.  Mantenga la zona seca hasta que le hayan quitado el taponamiento.  Despus de Wal-Martque le quiten el taponamiento, puede comenzar a Corporate investment bankerducharse. Si tuvo una marsupializacin:  Puede comenzar a tomar Engineer, productionuna ducha el da despus de la ciruga o cuando el mdico lo autorice.  Retire el vendaje antes de ducharse, pero permita que el agua de la ducha humedezca el vendaje antes de quitarlo. Esto har que sea ms fcil de quitar.  Pregntele al mdico  cundo debe dejar de usar el vendaje. Si tuvo una incisin y drenaje sin taponamiento de la herida:  Cambie el vendaje segn las indicaciones.  No retire los puntos (suturas), la goma para cerrar la piel o las tiras Bee Branchadhesivas. Es posible que estos cierres cutneos deban quedar puestos en la piel durante 2semanas o ms tiempo. Si los bordes de las tiras  [address] F[address] Rdadhesivas empiezan a despegarse y Scientific laboratory technicianenroscarse, puede recortar los que estn sueltos. No retire las tiras Agilent Technologiesadhesivas por completo a menos que el mdico se lo indique. Comunquese con un mdico si:  Tiene enrojecimiento, hinchazn o ms dolor alrededor de la herida.  Le sale ms lquido o sangre de la herida.  Tiene nuevo sangrado de la herida.  La herida se siente caliente al tacto.  Sale mal olor o pus de la herida.  Su dolor no se alivia con medicamentos.  Tiene fiebre o escalofros.  Tiene dolores musculares.  Tiene mareos.  Siente un Engineer, maintenance (IT)malestar generalizado. Resumen  Despus de un procedimiento para drenar un quiste pilonidal, es comn que salga algo de lquido o sangre de la herida.  Si le recetaron un antibitico, tmelo como se lo haya indicado el mdico. No deje de tomar el antibitico aunque comience a sentirse mejor.  Regrese al Yahoomdico como se le indic para que le cambien o le quiten Animatorel material de taponamiento. Esta informacin no tiene Theme park managercomo fin reemplazar el consejo del mdico. Asegrese de hacerle al mdico cualquier pregunta que tenga. Document Released: 05/01/2013 Document Revised: 06/08/2018 Document Reviewed: 06/08/2018 Elsevier Patient Education  2020 Ar[employer]vier Inc. Hemorroides Hemorrhoids Las hemorroides son venas inflamadas adentro o alrededor del recto o del ano. Hay dos tipos de hemorroides:  Hemorroides internas. Se forman en las venas del interior del recto. Pueden abultarse hacia afuera, irritarse y doler.  Hemorroides externas. Se producen en las venas externas del ano y pueden sentirse como un bulto o zona hinchada, dura y dolorosa cerca del ano. La mayora de las hemorroides no causan problemas graves y se Sports coachpueden controlar con tratamientos caseros Lubrizol Corporationcomo los cambios en la dieta y el estilo de vida. Si los tratamientos caseros no ayudan con los sntomas, se pueden Education officer, environmentalrealizar procedimientos para reducir o extirpar las hemorroides. Cules son las causas? La  causa de esta afeccin es el aumento de la presin en la zona anal. Esta presin puede ser causada por distintos factores, por ejemplo:  Estreimiento.  Hacer un gran esfuerzo para defecar.  Diarrea.  Embarazo.  Obesidad.  Estar sentado durante largos perodos de Hastingstiempo.  Levantar objetos pesados u otras actividades que impliquen esfuerzo.  Sexo anal.  Andar en bicicleta por un largo perodo de tiempo. Cules son los signos o los sntomas? Los sntomas de esta afeccin incluyen los siguientes:  Engineer, miningDolor.  Picazn o irritacin anal.  Sangrado rectal.  Prdida de materia fecal (heces).  Inflamacin anal.  Uno o ms bultos alrededor del ano. Cmo se diagnostica? Esta afeccin se diagnostica frecuentemente a travs de un examen visual. Posiblemente le realicen otros tipos de pruebas o estudios, como los siguientes:  Un examen que implica palpar el rea rectal con la mano enguantada (examen rectal digital).  Un examen del canal anal que se realiza utilizando un pequeo tubo (anoscopio).  Anlisis de sangre si ha perdido Burkina Fasouna cantidad significativa de Registersangre.  Una prueba que consiste en la observacin del interior del colon utilizando un tubo flexible con una cmara en el extremo (sigmoidoscopia o colonoscopa). Cmo se trata? Esta afeccin generalmente se puede  tratar en el hogar. Sin embargo, se pueden TEFL teacher procedimientos si los cambios en la dieta, en el estilo de vida y otros tratamientos caseros no Enterprise Products sntomas. Estos procedimientos pueden ayudar a reducir o Orchid hemorroides completamente. Algunos de estos procedimientos son quirrgicos y otros no. Algunos de los procedimientos ms frecuentes son los siguientes:  Ligadura con Forensic psychologist. Las bandas elsticas se colocan en la base de las hemorroides para interrumpir su irrigacin de [place].  Escleroterapia. Se inyecta un medicamento en las hemorroides para reducir su tamao.  Coagulacin  con luz infrarroja. Se utiliza un tipo de energa lumnica para eliminar las hemorroides.  Hemorroidectoma. Las hemorroides se extirpan con [place] y las venas que las [place] se IT consultant.  Hemorroidopexia con grapas. El cirujano engrapa la base de las hemorroides a la pared del recto. Siga estas indicaciones en su casa: Comida y bebida   Consuma alimentos con alto contenido de Claude, como cereales integrales, porotos, frutos secos, frutas y verduras.  Pregntele a su mdico acerca de tomar productos con fibra aadida en ellos (complementos de fibra).  Disminuya la cantidad de grasa de la dieta. Esto se puede lograr consumiendo productos lcteos con bajo contenido de grasas, ingiriendo menor cantidad de carnes rojas y evitando los alimentos procesados.  Beba suficiente lquido como para Theatre manager la orina de color amarillo plido. Control del dolor y la hinchazn   Tome baos de asiento tibios durante 20 minutos, 3 o 4 veces por da para Glass blower/designer y las [place]. Puede hacer esto en una baera o usar un dispositivo porttil para bao de asiento que se coloca sobre el inodoro.  Si se lo indican, aplique hielo en la zona afectada. Usar compresas de [employer]ant baos de asiento puede ser [place]. ? Ponga el hielo en una bolsa plstica. ? Coloque una [employer] piel y Therapist, nutritional. ? Coloque el hielo durante 50minutos, 2 a 3veces por da. Indicaciones generales  [employer]phi de venta libre y los recetados solamente como se lo haya indicado el mdico.  Aplquese los medicamentos, cremas o supositorios como se lo hayan indicado.  Haga ejercicio con regularidad. Consulte al mdico qu cantidad y qu tipo de ejercicio es mejor para usted. En general, debe realizar al menos 45minutos de ejercicio moderado la [employer] de la semana (150 minutos cada semana). Esto puede incluir Target Corporation, andar en bicicleta o practicar yoga.  Vaya al bao cuando  sienta la necesidad de defecar. No espere.  Evite hacer fuerza en las deposiciones.  Mantenga la zona anal limpia y seca. Use papel higinico hmedo o toallitas humedecidas despus de las deposiciones.  No pase mucho tiempo sentado en el inodoro. Esto aumenta la afluencia de sangre y Conservation officer, historic buildings.  Concurra a todas las visitas de seguimiento como se lo haya indicado el mdico. Esto es importante. Comunquese con un mdico si tiene:  Aumento del dolor y la hinchazn que no puede controlar con medicamentos o Clinical research associate.  No puede defecar o lo hace con dificultad.  Dolor o tiene inflamacin fuera de la zona de las hemorroides. Solicite ayuda inmediatamente si tiene:  Hemorragia descontrolada en el recto. Resumen  Las hemorroides son venas inflamadas adentro o alrededor del recto o del ano.  La mayora de las hemorroides se pueden controlar con tratamientos caseros como cambios en la dieta y el estilo de vida.  Tomar baos de asiento con agua tibia puede ayudar a Best boy y  las molestias.  En los casos graves, se pueden realizar procedimientos o Neomia Dearuna ciruga para reducir o extirpar las hemorroides. Esta informacin no tiene Theme park managercomo fin reemplazar el consejo del mdico. Asegrese de hacerle al mdico cualquier pregunta que tenga. Document Released: 07/11/2005 Document Revised: 01/18/2018 Document Reviewed: 01/18/2018 Elsevier Patient Education  2020 ArvinMeritorElsevier Inc.

## 2019-03-14 NOTE — Progress Notes (Signed)
Timothy CraftsHector Townsend; [id number]; 02/25/1961   HPI Patient is a 58 year old Hispanic male who was referred to my care by Dr. Phillips OdorGolding for evaluation treatment of hemorrhoidal disease.  History was obtained through an interpreter.  Patient states he has had a history of constipation and hemorrhoidal disease for some time now.  He recently states the hemorrhoidal pressure seems to be gone as his constipation has somewhat resolved.  He mostly complains of itching around the anus due to sweating while at work.  He denies any blood per rectum.  He currently has 0 out of 10 rectal pain.  He underwent a colonoscopy in 2016 and was found to have small internal hemorrhoids.  He sometimes has some bloody drainage from the pilonidal region down to the rectum.  When this is inflamed, it does cause him some rectal discomfort.  Rectal examination reveals no significant internal waterhammer to her Past Medical History:  Diagnosis Date  . Diabetes mellitus without complication [facility](HCC)     Past Surgical History:  Procedure Laterality Date  . COLONOSCOPY N/A 04/27/2015   Procedure: COLONOSCOPY;  Surgeon: West BaliSandi L Fields, MD;  Location: [facility];  Service: Endoscopy;  Laterality: N/A;  10:30 AM - moved to 10/3 @ 10:30  . NO PAST SURGERIES      History reviewed. No pertinent family history.  Current Outpatient Medications on File Prior to Visit  Medication Sig Dispense Refill  . aspirin 81 MG tablet Take 81 mg by mouth daily.    . metFORMIN (GLUMETZA) 500 MG (MOD) 24 hr tablet Take 500 mg by mouth at bedtime.    . insulin glargine (LANTUS) 100 UNIT/ML injection Inject 15 Units into the skin daily. Takes in the AM     No current facility-administered medications on file prior to visit.     No Known Allergies  Social History   Substance and Sexual Activity  Alcohol Use No    Social History   Tobacco Use  Smoking Status Never Smoker  Smokeless Tobacco Never Used    Review of Systems  Constitutional:  Positive for malaise/fatigue.  HENT: Negative.   Eyes: Negative.   Respiratory: Negative.   Cardiovascular: Negative.   Gastrointestinal: Negative.   Genitourinary: Negative.   Musculoskeletal: Negative.   Skin: Negative.   Neurological: Negative.   Endo/Heme/Allergies: Negative.   Psychiatric/Behavioral: Negative.     Objective   Vitals:   03/14/19 1131  BP: (!) 142/83  Pulse: 71  Resp: 16  Temp: 97.7 F (36.5 C)  SpO2: 96%    Physical Exam Vitals signs reviewed.  Constitutional:      Appearance: Normal appearance. He is not ill-appearing.  HENT:     Head: Atraumatic.  Cardiovascular:     Rate and Rhythm: Normal rate and regular rhythm.     Heart sounds: Normal heart sounds. No murmur. No friction rub. No gallop.   Pulmonary:     Effort: Pulmonary effort is normal. No respiratory distress.     Breath sounds: Normal breath sounds. No stridor. No wheezing, rhonchi or rales.  Genitourinary:    Comments: Rectal examination reveals no significant internal or external hemorrhoidal disease.  The skin around the anus appears to be within normal limits.  No blood is noted.  Sphincter tone is good.  Examination limited somewhat to patient discomfort. Skin:    General: Skin is warm and dry.     Comments: Evidence of previous inflammation in the pilonidal region, though no specific drainage is noted.  Minimal induration  is present in the area.  Neurological:     Mental Status: He is alert and oriented to person, place, and time.    Previous colonoscopy report reviewed Assessment  History of constipation, hemorrhoidal disease, possible history of pilonidal cyst.  Patient states he is remarkably doing better, thus there does not seem to be any active disease going on at this time. Plan   No need for surgical intervention at this time.  I did tell the patient should his constipation recur, he should contact Dr. Oneida Alar of gastroenterology as she has seen him in the past.  We also  explained what a pilonidal cyst is.  Literature was given concerning hemorrhoidal disease.  Should he develop any further episodes of rectal bleeding or hemorrhoidal disease, he was instructed to return to my office.  Follow-up here as needed.

## 2019-03-26 ENCOUNTER — Ambulatory Visit: Payer: BC Managed Care – PPO | Admitting: General Surgery

## 2019-04-19 DIAGNOSIS — Z6837 Body mass index (BMI) 37.0-37.9, adult: Secondary | ICD-10-CM | POA: Diagnosis not present

## 2019-04-19 DIAGNOSIS — R7989 Other specified abnormal findings of blood chemistry: Secondary | ICD-10-CM | POA: Diagnosis not present

## 2019-04-19 DIAGNOSIS — I10 Essential (primary) hypertension: Secondary | ICD-10-CM | POA: Diagnosis not present

## 2019-04-19 DIAGNOSIS — Z23 Encounter for immunization: Secondary | ICD-10-CM | POA: Diagnosis not present

## 2019-04-19 DIAGNOSIS — E1165 Type 2 diabetes mellitus with hyperglycemia: Secondary | ICD-10-CM | POA: Diagnosis not present

## 2019-04-19 DIAGNOSIS — G4730 Sleep apnea, unspecified: Secondary | ICD-10-CM | POA: Diagnosis not present

## 2019-05-20 DIAGNOSIS — R7989 Other specified abnormal findings of blood chemistry: Secondary | ICD-10-CM | POA: Diagnosis not present

## 2019-06-26 DIAGNOSIS — R7989 Other specified abnormal findings of blood chemistry: Secondary | ICD-10-CM | POA: Diagnosis not present

## 2019-07-12 DIAGNOSIS — E7849 Other hyperlipidemia: Secondary | ICD-10-CM | POA: Diagnosis not present

## 2019-07-12 DIAGNOSIS — E6609 Other obesity due to excess calories: Secondary | ICD-10-CM | POA: Diagnosis not present

## 2019-07-12 DIAGNOSIS — I10 Essential (primary) hypertension: Secondary | ICD-10-CM | POA: Diagnosis not present

## 2019-07-12 DIAGNOSIS — E1165 Type 2 diabetes mellitus with hyperglycemia: Secondary | ICD-10-CM | POA: Diagnosis not present

## 2019-07-12 DIAGNOSIS — R7989 Other specified abnormal findings of blood chemistry: Secondary | ICD-10-CM | POA: Diagnosis not present

## 2019-07-12 DIAGNOSIS — Z6835 Body mass index (BMI) 35.0-35.9, adult: Secondary | ICD-10-CM | POA: Diagnosis not present

## 2019-08-20 DIAGNOSIS — R7989 Other specified abnormal findings of blood chemistry: Secondary | ICD-10-CM | POA: Diagnosis not present

## 2019-09-13 DIAGNOSIS — R7989 Other specified abnormal findings of blood chemistry: Secondary | ICD-10-CM | POA: Diagnosis not present

## 2019-10-18 DIAGNOSIS — R7989 Other specified abnormal findings of blood chemistry: Secondary | ICD-10-CM | POA: Diagnosis not present

## 2020-01-31 DIAGNOSIS — Z23 Encounter for immunization: Secondary | ICD-10-CM | POA: Diagnosis not present

## 2020-01-31 DIAGNOSIS — R7989 Other specified abnormal findings of blood chemistry: Secondary | ICD-10-CM | POA: Diagnosis not present

## 2020-01-31 DIAGNOSIS — B351 Tinea unguium: Secondary | ICD-10-CM | POA: Diagnosis not present

## 2020-01-31 DIAGNOSIS — E1165 Type 2 diabetes mellitus with hyperglycemia: Secondary | ICD-10-CM | POA: Diagnosis not present

## 2020-01-31 DIAGNOSIS — I10 Essential (primary) hypertension: Secondary | ICD-10-CM | POA: Diagnosis not present

## 2020-01-31 DIAGNOSIS — Z1389 Encounter for screening for other disorder: Secondary | ICD-10-CM | POA: Diagnosis not present

## 2020-01-31 DIAGNOSIS — Z6836 Body mass index (BMI) 36.0-36.9, adult: Secondary | ICD-10-CM | POA: Diagnosis not present

## 2020-02-14 DIAGNOSIS — Z23 Encounter for immunization: Secondary | ICD-10-CM | POA: Diagnosis not present

## 2020-02-14 DIAGNOSIS — E6609 Other obesity due to excess calories: Secondary | ICD-10-CM | POA: Diagnosis not present

## 2020-02-14 DIAGNOSIS — Z1389 Encounter for screening for other disorder: Secondary | ICD-10-CM | POA: Diagnosis not present

## 2020-02-14 DIAGNOSIS — Z6836 Body mass index (BMI) 36.0-36.9, adult: Secondary | ICD-10-CM | POA: Diagnosis not present

## 2020-04-26 ENCOUNTER — Encounter (HOSPITAL_COMMUNITY): Payer: Self-pay | Admitting: Emergency Medicine

## 2020-04-26 ENCOUNTER — Emergency Department (HOSPITAL_COMMUNITY)
Admission: EM | Admit: 2020-04-26 | Discharge: 2020-04-27 | Disposition: A | Discharge status: Home / Self Care | Payer: BC Managed Care – PPO | Attending: Emergency Medicine | Admitting: Emergency Medicine

## 2020-04-26 ENCOUNTER — Other Ambulatory Visit: Payer: Self-pay

## 2020-04-26 DIAGNOSIS — Z5321 Procedure and treatment not carried out due to patient leaving prior to being seen by health care provider: Secondary | ICD-10-CM | POA: Insufficient documentation

## 2020-04-26 DIAGNOSIS — R509 Fever, unspecified: Secondary | ICD-10-CM | POA: Diagnosis not present

## 2020-04-26 DIAGNOSIS — U071 COVID-19: Secondary | ICD-10-CM | POA: Insufficient documentation

## 2020-04-26 HISTORY — DX: Pure hypercholesterolemia, unspecified: E78.00

## 2020-04-26 HISTORY — DX: Essential (primary) hypertension: I10

## 2020-04-26 LAB — RESPIRATORY PANEL BY RT PCR (FLU A&B, COVID)
Influenza A by PCR: NEGATIVE
Influenza B by PCR: NEGATIVE
SARS Coronavirus 2 by RT PCR: POSITIVE — AB

## 2020-04-26 NOTE — ED Notes (Signed)
Patient states its taking too long and he is ready to get in bed. lwbs

## 2020-04-26 NOTE — ED Triage Notes (Signed)
Pt reports cough, fever, and body aches since Friday.  His daughter has COVID.

## 2020-04-27 ENCOUNTER — Telehealth: Payer: Self-pay | Admitting: Family

## 2020-04-27 NOTE — Telephone Encounter (Signed)
I connected by phone with Timothy Townsend on 04/27/2020 at 11:13 AM with assistance of interpretor to discuss the potential use of a new treatment for mild to moderate COVID-19 viral infection in non-hospitalized patients. Discussed with his daughter [name] per [place].   Symptom onset 04/23/20. Positive test 04/26/20. Daughter tells me he is feeling better. We discussed need for infusion within 10 days which would be by date of 05/03/20. Provided with [facility] hotline [phone] to call back if they are interested  This patient is a 59 y.o. male that meets the FDA criteria for Emergency Use Authorization of COVID monoclonal antibody casirivimab/imdevimab or bamlanivimab/eteseviamb.  Has a (+) direct SARS-CoV-2 viral test result  Has mild or moderate COVID-19   Is NOT hospitalized due to COVID-19  Is within 10 days of symptom onset  Has at least one of the high risk factor(s) for progression to severe COVID-19 and/or hospitalization as defined in EUA.  Specific high risk criteria : BMI > 25, Diabetes and Other high risk medical condition per CDC:  ethnicity   I have spoken and communicated the following to the patient or parent/caregiver regarding COVID monoclonal antibody treatment:  1. FDA has authorized the emergency use for the treatment of mild to moderate COVID-19 in adults and pediatric patients with positive results of direct SARS-CoV-2 viral testing who are 86 years of age and older weighing at least 40 kg, and who are at high risk for progressing to severe COVID-19 and/or hospitalization.  2. The significant known and potential risks and benefits of COVID monoclonal antibody, and the extent to which such potential risks and benefits are unknown.  3. Information on available alternative treatments and the risks and benefits of those alternatives, including clinical trials.  4. Patients treated with COVID monoclonal antibody should continue to self-isolate and use infection control measures  (e.g., wear mask, isolate, social distance, avoid sharing personal items, clean and disinfect "high touch" surfaces, and frequent handwashing) according to CDC guidelines.   5. The patient or parent/caregiver has the option to accept or refuse COVID monoclonal antibody treatment.  After reviewing this information with the patient, the patient has DECLINED offer to receive the infusion.   Alver Sorrow, NP 04/27/2020 11:13 AM

## 2020-05-08 DIAGNOSIS — Z23 Encounter for immunization: Secondary | ICD-10-CM | POA: Diagnosis not present

## 2020-05-08 DIAGNOSIS — E291 Testicular hypofunction: Secondary | ICD-10-CM | POA: Diagnosis not present

## 2020-05-08 DIAGNOSIS — H8110 Benign paroxysmal vertigo, unspecified ear: Secondary | ICD-10-CM | POA: Diagnosis not present

## 2020-05-08 DIAGNOSIS — E119 Type 2 diabetes mellitus without complications: Secondary | ICD-10-CM | POA: Diagnosis not present

## 2020-05-08 DIAGNOSIS — Z6834 Body mass index (BMI) 34.0-34.9, adult: Secondary | ICD-10-CM | POA: Diagnosis not present

## 2020-05-22 DIAGNOSIS — R7989 Other specified abnormal findings of blood chemistry: Secondary | ICD-10-CM | POA: Diagnosis not present

## 2020-06-15 DIAGNOSIS — R7989 Other specified abnormal findings of blood chemistry: Secondary | ICD-10-CM | POA: Diagnosis not present

## 2020-07-14 DIAGNOSIS — R7989 Other specified abnormal findings of blood chemistry: Secondary | ICD-10-CM | POA: Diagnosis not present

## 2020-07-31 DIAGNOSIS — R7989 Other specified abnormal findings of blood chemistry: Secondary | ICD-10-CM | POA: Diagnosis not present

## 2020-08-14 DIAGNOSIS — Z6837 Body mass index (BMI) 37.0-37.9, adult: Secondary | ICD-10-CM | POA: Diagnosis not present

## 2020-08-14 DIAGNOSIS — R718 Other abnormality of red blood cells: Secondary | ICD-10-CM | POA: Diagnosis not present

## 2020-08-14 DIAGNOSIS — R7989 Other specified abnormal findings of blood chemistry: Secondary | ICD-10-CM | POA: Diagnosis not present

## 2020-08-14 DIAGNOSIS — I10 Essential (primary) hypertension: Secondary | ICD-10-CM | POA: Diagnosis not present

## 2020-08-14 DIAGNOSIS — E119 Type 2 diabetes mellitus without complications: Secondary | ICD-10-CM | POA: Diagnosis not present

## 2020-08-14 DIAGNOSIS — N390 Urinary tract infection, site not specified: Secondary | ICD-10-CM | POA: Diagnosis not present

## 2020-08-14 DIAGNOSIS — E7849 Other hyperlipidemia: Secondary | ICD-10-CM | POA: Diagnosis not present

## 2020-08-14 DIAGNOSIS — E1165 Type 2 diabetes mellitus with hyperglycemia: Secondary | ICD-10-CM | POA: Diagnosis not present

## 2020-08-28 DIAGNOSIS — R7989 Other specified abnormal findings of blood chemistry: Secondary | ICD-10-CM | POA: Diagnosis not present

## 2021-03-05 DIAGNOSIS — E782 Mixed hyperlipidemia: Secondary | ICD-10-CM | POA: Diagnosis not present

## 2021-03-05 DIAGNOSIS — Z6834 Body mass index (BMI) 34.0-34.9, adult: Secondary | ICD-10-CM | POA: Diagnosis not present

## 2021-03-05 DIAGNOSIS — E119 Type 2 diabetes mellitus without complications: Secondary | ICD-10-CM | POA: Diagnosis not present

## 2021-03-05 DIAGNOSIS — E6609 Other obesity due to excess calories: Secondary | ICD-10-CM | POA: Diagnosis not present

## 2021-03-05 DIAGNOSIS — R39198 Other difficulties with micturition: Secondary | ICD-10-CM | POA: Diagnosis not present

## 2021-03-05 DIAGNOSIS — I10 Essential (primary) hypertension: Secondary | ICD-10-CM | POA: Diagnosis not present

## 2021-03-05 DIAGNOSIS — R7989 Other specified abnormal findings of blood chemistry: Secondary | ICD-10-CM | POA: Diagnosis not present

## 2021-03-05 DIAGNOSIS — B372 Candidiasis of skin and nail: Secondary | ICD-10-CM | POA: Diagnosis not present

## 2021-04-08 DIAGNOSIS — R7989 Other specified abnormal findings of blood chemistry: Secondary | ICD-10-CM | POA: Diagnosis not present

## 2021-05-04 DIAGNOSIS — E291 Testicular hypofunction: Secondary | ICD-10-CM | POA: Diagnosis not present

## 2021-05-14 DIAGNOSIS — I10 Essential (primary) hypertension: Secondary | ICD-10-CM | POA: Diagnosis not present

## 2021-05-14 DIAGNOSIS — E291 Testicular hypofunction: Secondary | ICD-10-CM | POA: Diagnosis not present

## 2021-05-14 DIAGNOSIS — F5101 Primary insomnia: Secondary | ICD-10-CM | POA: Diagnosis not present

## 2021-05-14 DIAGNOSIS — Z6834 Body mass index (BMI) 34.0-34.9, adult: Secondary | ICD-10-CM | POA: Diagnosis not present

## 2021-05-14 DIAGNOSIS — E119 Type 2 diabetes mellitus without complications: Secondary | ICD-10-CM | POA: Diagnosis not present

## 2021-05-14 DIAGNOSIS — E782 Mixed hyperlipidemia: Secondary | ICD-10-CM | POA: Diagnosis not present

## 2021-05-14 DIAGNOSIS — Z23 Encounter for immunization: Secondary | ICD-10-CM | POA: Diagnosis not present

## 2021-10-10 ENCOUNTER — Encounter (HOSPITAL_COMMUNITY): Payer: Self-pay | Admitting: Emergency Medicine

## 2021-10-10 ENCOUNTER — Emergency Department (HOSPITAL_COMMUNITY)
Admission: EM | Admit: 2021-10-10 | Discharge: 2021-10-10 | Disposition: A | Discharge status: Home / Self Care | Payer: BC Managed Care – PPO | Attending: Emergency Medicine | Admitting: Emergency Medicine

## 2021-10-10 ENCOUNTER — Emergency Department (HOSPITAL_COMMUNITY): Payer: BC Managed Care – PPO

## 2021-10-10 DIAGNOSIS — N281 Cyst of kidney, acquired: Secondary | ICD-10-CM | POA: Diagnosis not present

## 2021-10-10 DIAGNOSIS — Z7982 Long term (current) use of aspirin: Secondary | ICD-10-CM | POA: Diagnosis not present

## 2021-10-10 DIAGNOSIS — Z794 Long term (current) use of insulin: Secondary | ICD-10-CM | POA: Diagnosis not present

## 2021-10-10 DIAGNOSIS — R197 Diarrhea, unspecified: Secondary | ICD-10-CM | POA: Diagnosis not present

## 2021-10-10 DIAGNOSIS — K5730 Diverticulosis of large intestine without perforation or abscess without bleeding: Secondary | ICD-10-CM | POA: Diagnosis not present

## 2021-10-10 DIAGNOSIS — K760 Fatty (change of) liver, not elsewhere classified: Secondary | ICD-10-CM | POA: Diagnosis not present

## 2021-10-10 DIAGNOSIS — R7401 Elevation of levels of liver transaminase levels: Secondary | ICD-10-CM

## 2021-10-10 DIAGNOSIS — K429 Umbilical hernia without obstruction or gangrene: Secondary | ICD-10-CM | POA: Diagnosis not present

## 2021-10-10 DIAGNOSIS — E119 Type 2 diabetes mellitus without complications: Secondary | ICD-10-CM | POA: Insufficient documentation

## 2021-10-10 DIAGNOSIS — R1084 Generalized abdominal pain: Secondary | ICD-10-CM | POA: Diagnosis not present

## 2021-10-10 LAB — CBC WITH DIFFERENTIAL/PLATELET
Abs Immature Granulocytes: 0.03 10*3/uL (ref 0.00–0.07)
Basophils Absolute: 0.1 10*3/uL (ref 0.0–0.1)
Basophils Relative: 1 %
Eosinophils Absolute: 0.1 10*3/uL (ref 0.0–0.5)
Eosinophils Relative: 1 %
HCT: 46.5 % (ref 39.0–52.0)
Hemoglobin: 15.9 g/dL (ref 13.0–17.0)
Immature Granulocytes: 0 %
Lymphocytes Relative: 21 %
Lymphs Abs: 2.1 10*3/uL (ref 0.7–4.0)
MCH: 29.8 pg (ref 26.0–34.0)
MCHC: 34.2 g/dL (ref 30.0–36.0)
MCV: 87.1 fL (ref 80.0–100.0)
Monocytes Absolute: 0.5 10*3/uL (ref 0.1–1.0)
Monocytes Relative: 5 %
Neutro Abs: 7.2 10*3/uL (ref 1.7–7.7)
Neutrophils Relative %: 72 %
Platelets: 271 10*3/uL (ref 150–400)
RBC: 5.34 MIL/uL (ref 4.22–5.81)
RDW: 12.9 % (ref 11.5–15.5)
WBC: 10 10*3/uL (ref 4.0–10.5)
nRBC: 0 % (ref 0.0–0.2)

## 2021-10-10 LAB — URINALYSIS, ROUTINE W REFLEX MICROSCOPIC
Bacteria, UA: NONE SEEN
Bilirubin Urine: NEGATIVE
Glucose, UA: >=500 mg/dL — AB
Ketones, ur: NEGATIVE mg/dL
Leukocytes,Ua: NEGATIVE
Nitrite: NEGATIVE
Protein, ur: NEGATIVE mg/dL
Specific Gravity, Urine: 1.023 (ref 1.005–1.030)
pH: 8 (ref 5.0–8.0)

## 2021-10-10 LAB — COMPREHENSIVE METABOLIC PANEL
ALT: 132 U/L — ABNORMAL HIGH (ref 0–44)
AST: 228 U/L — ABNORMAL HIGH (ref 15–41)
Albumin: 4 g/dL (ref 3.5–5.0)
Alkaline Phosphatase: 128 U/L — ABNORMAL HIGH (ref 38–126)
Anion gap: 10 (ref 5–15)
BUN: 13 mg/dL (ref 6–20)
CO2: 26 mmol/L (ref 22–32)
Calcium: 9 mg/dL (ref 8.9–10.3)
Chloride: 98 mmol/L (ref 98–111)
Creatinine, Ser: 0.94 mg/dL (ref 0.61–1.24)
GFR, Estimated: >60 mL/min (ref >60)
Glucose, Bld: 215 mg/dL — ABNORMAL HIGH (ref 70–99)
Potassium: 3.6 mmol/L (ref 3.5–5.1)
Sodium: 134 mmol/L — ABNORMAL LOW (ref 135–145)
Total Bilirubin: 1.2 mg/dL (ref 0.3–1.2)
Total Protein: 7.5 g/dL (ref 6.5–8.1)

## 2021-10-10 LAB — ACETAMINOPHEN LEVEL: Acetaminophen (Tylenol), Serum: <10 ug/mL — ABNORMAL LOW (ref 10–30)

## 2021-10-10 LAB — LIPASE, BLOOD: Lipase: 40 U/L (ref 11–51)

## 2021-10-10 LAB — LACTIC ACID, PLASMA: Lactic Acid, Venous: 2.5 mmol/L (ref 0.5–1.9)

## 2021-10-10 MED ORDER — NAPROXEN 500 MG PO TABS: 500.0000 mg | ORAL_TABLET | Freq: Two times a day (BID) | ORAL | 0 refills | Status: DC

## 2021-10-10 MED ORDER — IOHEXOL 300 MG/ML  SOLN
100.0000 mL | Freq: Once | INTRAMUSCULAR | Status: AC | PRN
  Administered 2021-10-10: 100 mL via INTRAVENOUS

## 2021-10-10 NOTE — ED Provider Notes (Signed)
?[facility] EMERGENC[facility] DEPARTMENT ?Provider Note ? ? ?CSN: [id number] ?Arrival date & time: 10/10/21  1505 ? ?  ? ?History ? ?Chief Complaint  ?Patient presents with  ? Abdominal Pain  ? ? ?Timothy Townsend is a 61 y.o. male. ? ? ?Abdominal Pain ? ?This patient is a 61 year old male, he has a history of dyslipidemia on icosapent, he also in the past has been prescribed insulin and metformin though he states he does not use those at this time.  He presents to the hospital with a complaint of abdominal discomfort which started 5 days ago initially in the left flank, then it came around to the left abdomen, it seems to come and go but is currently in the right mid abdomen.  He is not nauseated or vomiting, he had a small amount of diarrhea.  He feels like he had some fevers that have been coming and going because he has been sweating profusely at night occasionally.  He has not had any medication for the pain, he has not had any urinary symptoms including dysuria or hematuria.  He reports that he has never had any abdominal surgery.  Currently the pain is in the right mid abdomen in the epigastrium. ? ?Home Medications ?Prior to Admission medications   ?Medication Sig Start Date End Date Taking? Authorizing Provider  ?naproxen (NAPROSYN) 500 MG tablet Take 1 tablet (500 mg total) by mouth 2 (two) times daily with a meal. 10/10/21  Yes Eber Hong, MD  ?aspirin 81 MG tablet Take 81 mg by mouth daily.    [provider]  ?insulin glargine (LANTUS) 100 UNIT/ML injection Inject 15 Units into the skin daily. Takes in the AM    [provider]  ?metFORMIN (GLUMETZA) 500 MG (MOD) 24 hr tablet Take 500 mg by mouth at bedtime.    [provider]  ?   ? ?Allergies    ?Patient has no known allergies.   ? ?Review of Systems   ?Review of Systems  ?Gastrointestinal:  Positive for abdominal pain.  ?All other systems reviewed and are negative. ? ?Physical Exam ?Updated Vital Signs ?BP 129/86   Pulse 71   Temp  98.3 ?F (36.8 ?C) (Oral)   Resp 17   Ht 1.575 m (5\' 2" )   Wt 98 kg   SpO2 100%   BMI 39.51 kg/m?  ?Physical Exam ?Vitals and nursing note reviewed.  ?Constitutional:   ?   General: He is not in acute distress. ?   Appearance: He is well-developed.  ?HENT:  ?   Head: Normocephalic and atraumatic.  ?   Mouth/Throat:  ?   Pharynx: No oropharyngeal exudate.  ?Eyes:  ?   General: No scleral icterus.    ?   Right eye: No discharge.     ?   Left eye: No discharge.  ?   Conjunctiva/sclera: Conjunctivae normal.  ?   Pupils: Pupils are equal, round, and reactive to light.  ?Neck:  ?   Thyroid: No thyromegaly.  ?   Vascular: No JVD.  ?Cardiovascular:  ?   Rate and Rhythm: Normal rate and regular rhythm.  ?   Heart sounds: Normal heart sounds. No murmur heard. ?  No friction rub. No gallop.  ?Pulmonary:  ?   Effort: Pulmonary effort is normal. No respiratory distress.  ?   Breath sounds: Normal breath sounds. No wheezing or rales.  ?Abdominal:  ?   General: Bowel sounds are normal. There is no distension.  ?  Palpations: Abdomen is soft. There is no mass.  ?   Tenderness: There is abdominal tenderness.  ?   Comments: The patient has a very soft abdomen, he is obese, he is able to tolerate deep palpation but complains of pain in the mid abdomen the right mid abdomen in the epigastrium.  Minimal tenderness on the left side of the abdomen.  There is no guarding or peritoneal signs and there is no CVA tenderness  ?Musculoskeletal:     ?   General: No tenderness. Normal range of motion.  ?   Cervical back: Normal range of motion and neck supple.  ?Lymphadenopathy:  ?   Cervical: No cervical adenopathy.  ?Skin: ?   General: Skin is warm and dry.  ?   Findings: No erythema or rash.  ?Neurological:  ?   General: No focal deficit present.  ?   Mental Status: He is alert.  ?   Coordination: Coordination normal.  ?   Comments: Normal speech, normal gait, normal facial symmetry, normal coordination of all 4 extremities  ?Psychiatric:      ?   Behavior: Behavior normal.  ? ? ?ED Results / Procedures / Treatments   ?Labs ?(all labs ordered are listed, but only abnormal results are displayed) ?Labs Reviewed  ?COMPREHENSIVE METABOLIC PANEL - Abnormal; Notable for the following components:  ?    Result Value  ? Sodium 134 (*)   ? Glucose, Bld 215 (*)   ? AST 228 (*)   ? ALT 132 (*)   ? Alkaline Phosphatase 128 (*)   ? All other components within normal limits  ?URINALYSIS, ROUTINE W REFLEX MICROSCOPIC - Abnormal; Notable for the following components:  ? Glucose, UA >=500 (*)   ? Hgb urine dipstick SMALL (*)   ? All other components within normal limits  ?LACTIC ACID, PLASMA - Abnormal; Notable for the following components:  ? Lactic Acid, Venous 2.5 (*)   ? All other components within normal limits  ?ACETAMINOPHEN LEVEL - Abnormal; Notable for the following components:  ? Acetaminophen (Tylenol), Serum <10 (*)   ? All other components within normal limits  ?CBC WITH DIFFERENTIAL/PLATELET  ?LIPASE, BLOOD  ?HEPATITIS PANEL, ACUTE  ? ? ?EKG ?None ? ?Radiology ?CT ABDOMEN PELVIS W CONTRAST ? ?Result Date: 10/10/2021 ?CLINICAL DATA:  RLQ abdominal pain (Age >= 14y) fever, R sided abdominal pain Patient also reports left upper quadrant pain EXAM: CT ABDOMEN AND PELVIS WITH CONTRAST TECHNIQUE: Multidetector CT imaging of the abdomen and pelvis was performed using the standard protocol following bolus administration of intravenous contrast. RADIATION DOSE REDUCTION: This exam was performed according to the departmental dose-optimization program which includes automated exposure control, adjustment of the mA and/or kV according to patient size and/or use of iterative reconstruction technique. CONTRAST:  OMNIPAQUE IOHEXOL 300 MG/ML  SOLN COMPARISON:  None. FINDINGS: Lower chest: No acute airspace disease or pleural effusion. Hepatobiliary: Diffusely decreased hepatic density consistent with steatosis. The liver is enlarged spanning 21.8 cm cranial caudal  no calcified gallstone or gallbladder inflammation no biliary dilatation. Pancreas: No ductal dilatation or inflammation. No evidence of pancreatic mass. Spleen: Normal in size without focal abnormality. Adrenals/Urinary Tract: Normal adrenal glands. No hydronephrosis or perinephric edema. Homogeneous renal enhancement with symmetric excretion on delayed phase imaging. No urolithiasis. Small cysts within both kidneys urinary bladder is physiologically distended without wall thickening. Stomach/Bowel: Normal appendix, series 2, image 62. The stomach is distended with fluid/ingested material. Tiny duodenal diverticulum. No small bowel obstruction  or inflammation. There is mild fecalization of distal small bowel contents. Moderate volume of colonic stool. Equivocal wall thickening of the ascending and hepatic flexure of the colon versus nondistention, although no pericolonic edema. The sigmoid colon is redundant. Mild descending colonic diverticulosis without diverticulitis. Vascular/Lymphatic: Normal caliber abdominal aorta. Patent portal, mesenteric and splenic veins. Circumaortic left renal vein. No abdominopelvic adenopathy. Reproductive: Prostate is unremarkable. Other: No free air, free fluid, or intra-abdominal fluid collection. Tiny fat containing umbilical hernia. Musculoskeletal: Mild lumbar spondylosis with spurring. There are no acute or suspicious osseous abnormalities. Tiny scattered bone islands in the pelvis. IMPRESSION: 1. Equivocal wall thickening of the ascending and hepatic flexure of the colon versus nondistention, although no pericolonic edema. This could be seen with mild colitis. 2. Normal appendix. 3. Hepatomegaly and hepatic steatosis. 4. Mild descending colonic diverticulosis without diverticulitis. 5. Moderate colonic stool burden with colonic tortuosity and fecalized small bowel contents, suggestive of slow transit. Electronically Signed   By: Narda RutherfordMelanie  Sanford M.D.   On: 10/10/2021 16:47    ? ?Procedures ?Procedures  ? ? ?Medications Ordered in ED ?Medications  ?iohexol (OMNIPAQUE) 300 MG/ML solution 100 mL (100 mLs Intravenous Contrast Given 10/10/21 1623)  ? ? ?ED Course/ Medical Decision Makin

## 2021-10-10 NOTE — ED Triage Notes (Signed)
Pain to LUQ that started on Tuesday and epigastric pain/ burning that started today.  ?

## 2021-10-10 NOTE — Discharge Instructions (Signed)
Your testing does not reveal any specific abnormalities except that your liver tests were slightly high.  This will need to be followed up at the GI specialist office and I have given you their phone number above, please call the office in the morning to make an appointment to be seen this week.  I would like for you to return to the emergency department for severe worsening symptoms including increasing pain or vomiting.  You may take medications such as Naprosyn twice a day for pain, I have prescribed ?

## 2021-10-11 LAB — HEPATITIS PANEL, ACUTE
HCV Ab: NONREACTIVE
Hep A IgM: NONREACTIVE
Hep B C IgM: NONREACTIVE
Hepatitis B Surface Ag: NONREACTIVE

## 2021-10-13 ENCOUNTER — Ambulatory Visit: Payer: BC Managed Care – PPO | Admitting: Internal Medicine

## 2021-10-13 ENCOUNTER — Other Ambulatory Visit: Payer: Self-pay

## 2021-10-13 VITALS — BP 130/64 | HR 88 | Temp 98.0°F | Ht 63.0 in | Wt 204.0 lb

## 2021-10-13 DIAGNOSIS — R7989 Other specified abnormal findings of blood chemistry: Secondary | ICD-10-CM | POA: Diagnosis not present

## 2021-10-13 DIAGNOSIS — K219 Gastro-esophageal reflux disease without esophagitis: Secondary | ICD-10-CM | POA: Diagnosis not present

## 2021-10-13 DIAGNOSIS — R1032 Left lower quadrant pain: Secondary | ICD-10-CM | POA: Diagnosis not present

## 2021-10-13 DIAGNOSIS — R933 Abnormal findings on diagnostic imaging of other parts of digestive tract: Secondary | ICD-10-CM | POA: Diagnosis not present

## 2021-10-13 NOTE — Progress Notes (Signed)
? ? ?Primary Care Physician:  Sharilyn Sites, MD ?Primary Gastroenterologist:  Dr. Abbey Chatters ? ?Chief Complaint  ?Patient presents with  ? Abdominal Pain  ?  Burning, no nausea  ? ? ?HPI:   ?Timothy Townsend is a 61 y.o. male who presents to clinic today for ER follow-up visit.  Has not been seen in our clinic before though did have a colonoscopy performed by Dr. Oneida Alar in 2016 which was unremarkable besides diverticulosis and hemorrhoids. ? ?Patient states approximately 4 days ago had sudden onset of abdominal pain.  Primarily left lower quadrant.  No diarrhea.  No melena hematochezia.  Presented to the ER and any pain.  CT abdomen pelvis with contrast showed possible colitis of the ascending colon/hepatic flexure.  Was discharged home on naproxen.  Today states he is improved.  Has been taking naproxen. ? ?Notes normal bowel movements regularly.  No issues with constipation in the past.  CT imaging did mention stool burden. Work-up in the ER also showed abnormal liver function test.  AST 228, ALT 132, T. bili 1.2, alk phos 128.  CT mentioned fatty liver.  Patient endorses very sporadic alcohol use on holidays.  No family history of liver disease.  No new medication.  No herbal supplements.  No illicit drug use.  Viral hepatitis panel negative in the ER. ? ?Patient does note mild GERD.  Intermittent, diet related.  No dysphagia odynophagia.  No epigastric or chest pain. ? ?No family history of colorectal malignancy. ? ? ? ?Past Medical History:  ?Diagnosis Date  ? Diabetes mellitus without complication ([place])   ? High cholesterol   ? Hypertension   ? ? ?Past Surgical History:  ?Procedure Laterality Date  ? COLONOSCOPY N/A 04/27/2015  ? Procedure: COLONOSCOPY;  Surgeon: Danie Binder, MD;  Location: [facility];  Service: Endoscopy;  Laterality: N/A;  10:30 AM - moved to 10/3 @ 10:30  ? NO PAST SURGERIES    ? ? ?Current Outpatient Medications  ?Medication Sig Dispense Refill  ? metFORMIN (GLUMETZA) 500 MG (MOD) 24 hr  tablet Take 500 mg by mouth at bedtime.    ? naproxen (NAPROSYN) 500 MG tablet Take 1 tablet (500 mg total) by mouth 2 (two) times daily with a meal. 30 tablet 0  ? aspirin 81 MG tablet Take 81 mg by mouth daily. (Patient not taking: Reported on 10/13/2021)    ? insulin glargine (LANTUS) 100 UNIT/ML injection Inject 15 Units into the skin daily. Takes in the AM (Patient not taking: Reported on 10/13/2021)    ? ?No current facility-administered medications for this visit.  ? ? ?Allergies as of 10/13/2021  ? (No Known Allergies)  ? ? ?No family history on file. ? ?Social History  ? ?Socioeconomic History  ? Marital status: Married  ?  Spouse name: Not on file  ? Number of children: Not on file  ? Years of education: Not on file  ? Highest education level: Not on file  ?Occupational History  ? Not on file  ?Tobacco Use  ? Smoking status: Never  ? Smokeless tobacco: Never  ?Substance and Sexual Activity  ? Alcohol use: No  ? Drug use: No  ? Sexual activity: Not on file  ?Other Topics Concern  ? Not on file  ?Social History Narrative  ? Not on file  ? ?Social Determinants of Health  ? ?Financial Resource Strain: Not on file  ?Food Insecurity: Not on file  ?Transportation Needs: Not on file  ?Physical Activity: Not  on file  ?Stress: Not on file  ?Social Connections: Not on file  ?Intimate Partner Violence: Not on file  ? ? ?Subjective: ?Review of Systems  ?Constitutional:  Negative for chills and fever.  ?HENT:  Negative for congestion and hearing loss.   ?Eyes:  Negative for blurred vision and double vision.  ?Respiratory:  Negative for cough and shortness of breath.   ?Cardiovascular:  Negative for chest pain and palpitations.  ?Gastrointestinal:  Positive for abdominal pain. Negative for blood in stool, constipation, diarrhea, heartburn, melena and vomiting.  ?Genitourinary:  Negative for dysuria and urgency.  ?Musculoskeletal:  Negative for joint pain and myalgias.  ?Skin:  Negative for itching and rash.  ?Neurological:   Negative for dizziness and headaches.  ?Psychiatric/Behavioral:  Negative for depression. The patient is not nervous/anxious.    ? ? ? ?Objective: ?BP 130/64   Pulse 88   Temp 98 ?F (36.7 ?C)   Ht '5\' 3"'  (1.6 m)   Wt 204 lb (92.5 kg)   BMI 36.14 kg/m?  ?Physical Exam ?Constitutional:   ?   Appearance: Normal appearance.  ?HENT:  ?   Head: Normocephalic and atraumatic.  ?Eyes:  ?   Extraocular Movements: Extraocular movements intact.  ?   Conjunctiva/sclera: Conjunctivae normal.  ?Cardiovascular:  ?   Rate and Rhythm: Normal rate and regular rhythm.  ?Pulmonary:  ?   Effort: Pulmonary effort is normal.  ?   Breath sounds: Normal breath sounds.  ?Abdominal:  ?   General: Bowel sounds are normal.  ?   Palpations: Abdomen is soft.  ?Musculoskeletal:     ?   General: Normal range of motion.  ?   Cervical back: Normal range of motion and neck supple.  ?Skin: ?   General: Skin is warm.  ?Neurological:  ?   General: No focal deficit present.  ?   Mental Status: He is alert and oriented to person, place, and time.  ?Psychiatric:     ?   Mood and Affect: Mood normal.     ?   Behavior: Behavior normal.  ? ? ? ?Assessment: ?*Abdominal pain-left lower quadrant ?*Abnormal CT scan-questionable colitis ?*GERD-mild, intermittent ?*Abnormal liver function test ? ?Plan: ?Etiology of patient's abdominal pain unclear.  Given questionable abnormal CT imaging, colitis certainly on differential. ? ?I have recommended colonoscopy to further evaluate and patient is agreeable. The risks including infection, bleed, or perforation as well as benefits, limitations, alternatives and imponderables have been reviewed with the patient. Questions have been answered. All parties agreeable. ? ?GERD mild intermittent, continue to monitor.  Counseled on diet.  Can consider H2 blocker if becomes more consistent.  No warning signs today to warrant upper endoscopy. ? ?Discussed abnormal liver function test as well.  Patient with evidence of fatty liver  on CT imaging.  Very sporadic alcohol use mainly on holidays.  Possible NASH though could have abnormal aminotransferases in the setting of infection/colitis as well. ? ?We will recheck LFTs in 3 months.  If still elevated, will perform full serological work-up at that time. ? ?Patient to follow-up with me after his procedures. ? ? ?10/13/2021 4:15 PM ? ? ?Disclaimer: This note was dictated with voice recognition software. Similar sounding words can inadvertently be transcribed and may not be corrected upon review. ? ?

## 2021-10-13 NOTE — Patient Instructions (Addendum)
We will schedule you for colonoscopy to further evaluate your colitis and abdominal pain. ? ?You will follow-up with me in 3 to 4 months.  We will repeat blood work to check your liver numbers.  If liver tests are still abnormal, then we will perform further testing. ? ?It was nice meeting both of you today. ? ?Dr. Marletta Lor ? ?At [facility] we value your feedback. You may receive a survey about your visit today. Please share your experience as we strive to create trusting relationships with our patients to provide genuine, compassionate, quality care. ? ?We appreciate your understanding and patience as we review any laboratory studies, imaging, and other diagnostic tests that are ordered as we care for you. Our office policy is 5 business days for review of these results, and any emergent or urgent results are addressed in a timely manner for your best interest. If you do not hear from our office in 1 week, please contact us.  ? ?We also encourage the use of MyChart, which contains your medical information for your review as well. If you are not enrolled in this feature, an access code is on this after visit summary for your convenience. Thank you for allowing [place] to be involved in your care. ? ?It was great to see you today!  I hope you have a great rest of your Winter! ? ? ? ?Hennie Duos. Marletta Lor, D.O. ?Gastroenterology and Hepatology ?[facility] ? ?

## 2021-10-14 ENCOUNTER — Telehealth: Payer: Self-pay | Admitting: *Deleted

## 2021-10-14 NOTE — Telephone Encounter (Signed)
Called pt to schedule colonoscopy with Dr. Abbey Chatters, ASA 2. Phone # listed has been disconnected. No DPR on file and no emergency contact listed. Letter mailed to have pt call us. ?

## 2021-10-21 MED ORDER — PEG 3350-KCL-NA BICARB-NACL 420 G PO SOLR: ORAL | 0 refills | Status: DC

## 2021-10-21 NOTE — Telephone Encounter (Signed)
Pt daughter Clotilde Dieter called. Pt scheduled for 4/28 at 8am. Aware will send prep rx to pharmacy and mail instructions. ?

## 2021-10-21 NOTE — Addendum Note (Signed)
Addended by: Cheron Every on: 10/21/2021 10:27 AM ? ? Modules accepted: Orders ? ?

## 2021-11-19 ENCOUNTER — Encounter (HOSPITAL_COMMUNITY): Payer: Self-pay

## 2021-11-19 ENCOUNTER — Ambulatory Visit (HOSPITAL_COMMUNITY): Payer: BC Managed Care – PPO | Admitting: Anesthesiology

## 2021-11-19 ENCOUNTER — Other Ambulatory Visit: Payer: Self-pay

## 2021-11-19 ENCOUNTER — Ambulatory Visit (HOSPITAL_COMMUNITY)
Admission: RE | Admit: 2021-11-19 | Discharge: 2021-11-19 | Disposition: A | Discharge status: Home / Self Care | Payer: BC Managed Care – PPO | Attending: Internal Medicine | Admitting: Internal Medicine

## 2021-11-19 ENCOUNTER — Encounter (HOSPITAL_COMMUNITY)
Admission: RE | Disposition: A | Discharge status: Home / Self Care | Payer: Self-pay | Source: Home / Self Care | Attending: Internal Medicine

## 2021-11-19 DIAGNOSIS — Z8719 Personal history of other diseases of the digestive system: Secondary | ICD-10-CM | POA: Diagnosis not present

## 2021-11-19 DIAGNOSIS — E119 Type 2 diabetes mellitus without complications: Secondary | ICD-10-CM | POA: Diagnosis not present

## 2021-11-19 DIAGNOSIS — K648 Other hemorrhoids: Secondary | ICD-10-CM | POA: Insufficient documentation

## 2021-11-19 DIAGNOSIS — K5730 Diverticulosis of large intestine without perforation or abscess without bleeding: Secondary | ICD-10-CM | POA: Insufficient documentation

## 2021-11-19 DIAGNOSIS — Z7984 Long term (current) use of oral hypoglycemic drugs: Secondary | ICD-10-CM | POA: Diagnosis not present

## 2021-11-19 DIAGNOSIS — Z79899 Other long term (current) drug therapy: Secondary | ICD-10-CM | POA: Insufficient documentation

## 2021-11-19 DIAGNOSIS — K635 Polyp of colon: Secondary | ICD-10-CM

## 2021-11-19 DIAGNOSIS — R933 Abnormal findings on diagnostic imaging of other parts of digestive tract: Secondary | ICD-10-CM | POA: Diagnosis not present

## 2021-11-19 DIAGNOSIS — I10 Essential (primary) hypertension: Secondary | ICD-10-CM | POA: Diagnosis not present

## 2021-11-19 HISTORY — PX: COLONOSCOPY WITH PROPOFOL: SHX5780

## 2021-11-19 HISTORY — PX: POLYPECTOMY: SHX5525

## 2021-11-19 LAB — GLUCOSE, CAPILLARY: Glucose-Capillary: 159 mg/dL — ABNORMAL HIGH (ref 70–99)

## 2021-11-19 SURGERY — COLONOSCOPY WITH PROPOFOL
Anesthesia: General

## 2021-11-19 MED ORDER — PROPOFOL 10 MG/ML IV BOLUS
INTRAVENOUS | Status: DC | PRN
  Administered 2021-11-19: 30 mg via INTRAVENOUS
  Administered 2021-11-19: 100 mg via INTRAVENOUS
  Administered 2021-11-19 (×2): 50 mg via INTRAVENOUS

## 2021-11-19 MED ORDER — LACTATED RINGERS IV SOLN
INTRAVENOUS | Status: DC
  Administered 2021-11-19: 1000 mL via INTRAVENOUS

## 2021-11-19 MED ORDER — LIDOCAINE HCL (CARDIAC) PF 100 MG/5ML IV SOSY
PREFILLED_SYRINGE | INTRAVENOUS | Status: DC | PRN
Start: 2021-11-19 — End: 2021-11-19
  Administered 2021-11-19: 50 mg via INTRAVENOUS

## 2021-11-19 NOTE — Discharge Instructions (Addendum)
?  Colonoscopy ?Discharge Instructions ? ?Read the instructions outlined below and refer to this sheet in the next few weeks. These discharge instructions provide you with general information on caring for yourself after you leave the hospital. Your doctor may also give you specific instructions. While your treatment has been planned according to the most current medical practices available, unavoidable complications occasionally occur.  ? ?ACTIVITY ?You may resume your regular activity, but move at a slower pace for the next 24 hours.  ?Take frequent rest periods for the next 24 hours.  ?Walking will help get rid of the air and reduce the bloated feeling in your belly (abdomen).  ?No driving for 24 hours (because of the medicine (anesthesia) used during the test).   ?Do not sign any important legal documents or operate any machinery for 24 hours (because of the anesthesia used during the test).  ?NUTRITION ?Drink plenty of fluids.  ?You may resume your normal diet as instructed by your doctor.  ?Begin with a light meal and progress to your normal diet. Heavy or fried foods are harder to digest and may make you feel sick to your stomach (nauseated).  ?Avoid alcoholic beverages for 24 hours or as instructed.  ?MEDICATIONS ?You may resume your normal medications unless your doctor tells you otherwise.  ?WHAT YOU CAN EXPECT TODAY ?Some feelings of bloating in the abdomen.  ?Passage of more gas than usual.  ?Spotting of blood in your stool or on the toilet paper.  ?IF YOU HAD POLYPS REMOVED DURING THE COLONOSCOPY: ?No aspirin products for 7 days or as instructed.  ?No alcohol for 7 days or as instructed.  ?Eat a soft diet for the next 24 hours.  ?FINDING OUT THE RESULTS OF YOUR TEST ?Not all test results are available during your visit. If your test results are not back during the visit, make an appointment with your caregiver to find out the results. Do not assume everything is normal if you have not heard from your  caregiver or the medical facility. It is important for you to follow up on all of your test results.  ?SEEK IMMEDIATE MEDICAL ATTENTION IF: ?You have more than a spotting of blood in your stool.  ?Your belly is swollen (abdominal distention).  ?You are nauseated or vomiting.  ?You have a temperature over 101.  ?You have abdominal pain or discomfort that is severe or gets worse throughout the day.  ? ?Your colonoscopy revealed 1 polyp(s) which I removed successfully. Await pathology results, my office will contact you. I recommend repeating colonoscopy in 5 years for surveillance purposes. You also have diverticulosis and internal hemorrhoids. I would recommend increasing fiber in your diet or adding OTC Benefiber/Metamucil. Be sure to drink at least 4 to 6 glasses of water daily.  ? ?I did not see any active inflammation throughout your entire colon.  Previously noted colitis has resolved. ? ?Follow-up with GI as needed. ? ? ? ?I hope you have a great rest of your week! ? ?Hennie Duos. Marletta Lor, D.O. ?Gastroenterology and Hepatology ?[facility] ? ?

## 2021-11-19 NOTE — Transfer of Care (Signed)
Immediate Anesthesia Transfer of Care Note ? ?Patient: Timothy Townsend ? ?Procedure(s) Performed: COLONOSCOPY WITH PROPOFOL ?POLYPECTOMY ? ?Patient Location: Endoscopy Unit ? ?Anesthesia Type:General ? ?Level of Consciousness: drowsy ? ?Airway & Oxygen Therapy: Patient Spontanous Breathing ? ?Post-op Assessment: Report given to RN and Post -op Vital signs reviewed and stable ? ?Post vital signs: Reviewed and stable ? ?Last Vitals:  ?Vitals Value Taken Time  ?BP    ?Temp    ?Pulse    ?Resp    ?SpO2    ? ? ?Last Pain:  ?Vitals:  ? 11/19/21 0801  ?TempSrc:   ?PainSc: 0-No pain  ?   ? ?Patients Stated Pain Goal: 9 (11/19/21 0724) ? ?Complications: No notable events documented. ?

## 2021-11-19 NOTE — H&P (Signed)
Primary Care Physician:  Assunta Found, MD ?Primary Gastroenterologist:  Dr. Marletta Lor ? ?Pre-Procedure History & Physical: ?HPI:  Timothy Townsend is a 61 y.o. male is here for a colonoscopy to be performed abnormal CT, colitis.  ? ?Past Medical History:  ?Diagnosis Date  ? Diabetes mellitus without complication (HCC)   ? High cholesterol   ? Hypertension   ? ? ?Past Surgical History:  ?Procedure Laterality Date  ? COLONOSCOPY N/A 04/27/2015  ? Procedure: COLONOSCOPY;  Surgeon: West Bali, MD;  Location: [facility];  Service: Endoscopy;  Laterality: N/A;  10:30 AM - moved to 10/3 @ 10:30  ? NO PAST SURGERIES    ? ? ?Prior to Admission medications   ?Medication Sig Start Date End Date Taking? Authorizing Provider  ?PE-Shark Liver Oil-Cocoa Buttr (HEMORRHOID RE) Place 1 application. rectally daily as needed (hemorrhoids).   Yes [provider]  ?polyethylene glycol-electrolytes (NULYTELY) 420 g solution As directed 10/21/21  Yes Lanelle Bal, DO  ?TRIJARDY XR 12.5-2.11-998 MG TB24 Take 2 tablets by mouth every morning. 09/13/21  Yes [provider]  ?VASCEPA 1 g capsule Take 2 g by mouth 2 (two) times daily. 10/23/21  Yes [provider]  ?loratadine (CLARITIN) 10 MG tablet Take 10 mg by mouth daily as needed for allergies.    [provider]  ?naproxen (NAPROSYN) 500 MG tablet Take 1 tablet (500 mg total) by mouth 2 (two) times daily with a meal. ?Patient not taking: Reported on 11/16/2021 10/10/21   Eber Hong, MD  ?naproxen sodium (ALEVE) 220 MG tablet Take 440 mg by mouth 2 (two) times daily as needed (pain).    [provider]  ? ? ?Allergies as of 10/21/2021  ? (No Known Allergies)  ? ? ?History reviewed. No pertinent family history. ? ?Social History  ? ?Socioeconomic History  ? Marital status: Married  ?  Spouse name: Not on file  ? Number of children: Not on file  ? Years of education: Not on file  ? Highest education level: Not on file  ?Occupational History  ?  Not on file  ?Tobacco Use  ? Smoking status: Never  ? Smokeless tobacco: Never  ?Vaping Use  ? Vaping Use: Never used  ?Substance and Sexual Activity  ? Alcohol use: Yes  ?  Comment: occasional beer  ? Drug use: No  ? Sexual activity: Not on file  ?Other Topics Concern  ? Not on file  ?Social History Narrative  ? Not on file  ? ?Social Determinants of Health  ? ?Financial Resource Strain: Not on file  ?Food Insecurity: Not on file  ?Transportation Needs: Not on file  ?Physical Activity: Not on file  ?Stress: Not on file  ?Social Connections: Not on file  ?Intimate Partner Violence: Not on file  ? ? ?Review of Systems: ?See HPI, otherwise negative ROS ? ?Physical Exam: ?Vital signs in last 24 hours: ?Temp:  [97.8 ?F (36.6 ?C)] 97.8 ?F (36.6 ?C) (04/28 4401) ?Pulse Rate:  [70] 70 (04/28 0724) ?Resp:  [18] 18 (04/28 0724) ?BP: (133)/(85) 133/85 (04/28 0724) ?SpO2:  [96 %] 96 % (04/28 0724) ?Weight:  [93.4 kg] 93.4 kg (04/28 0724) ?  ?General:   Alert,  Well-developed, well-nourished, pleasant and cooperative in NAD ?Head:  Normocephalic and atraumatic. ?Eyes:  Sclera clear, no icterus.   Conjunctiva pink. ?Ears:  Normal auditory acuity. ?Nose:  No deformity, discharge,  or lesions. ?Mouth:  No deformity or lesions, dentition normal. ?Neck:  Supple; no  masses or thyromegaly. ?Lungs:  Clear throughout to auscultation.   No wheezes, crackles, or rhonchi. No acute distress. ?Heart:  Regular rate and rhythm; no murmurs, clicks, rubs,  or gallops. ?Abdomen:  Soft, nontender and nondistended. No masses, hepatosplenomegaly or hernias noted. Normal bowel sounds, without guarding, and without rebound.   ?Msk:  Symmetrical without gross deformities. Normal posture. ?Extremities:  Without clubbing or edema. ?Neurologic:  Alert and  oriented x4;  grossly normal neurologically. ?Skin:  Intact without significant lesions or rashes. ?Cervical Nodes:  No significant cervical adenopathy. ?Psych:  Alert and cooperative. Normal mood and  affect. ? ?Impression/Plan: ?Timothy Townsend is here for a colonoscopy to be performed abnormal CT, colitis.  ? ?The risks of the procedure including infection, bleed, or perforation as well as benefits, limitations, alternatives and imponderables have been reviewed with the patient. Questions have been answered. All parties agreeable. ? ? ?

## 2021-11-19 NOTE — Anesthesia Procedure Notes (Signed)
Date/Time: 11/19/2021 8:07 AM ?Performed by: Julian Reil, CRNA ?Pre-anesthesia Checklist: Patient identified, Emergency Drugs available, Suction available and Patient being monitored ?Patient Re-evaluated:Patient Re-evaluated prior to induction ?Oxygen Delivery Method: Nasal cannula ?Induction Type: IV induction ?Placement Confirmation: positive ETCO2 ? ? ? ? ?

## 2021-11-19 NOTE — Op Note (Signed)
[facility] ?Patient Name: Timothy Townsend ?Procedure Date: 11/19/2021 7:51 AM ?MRN: [id number] ?Date of Birth: Nov 29, 1960 ?Attending MD: Elon Alas. Abbey Chatters , DO ?CSN: [id number] ?Age: 61 ?Admit Type: Outpatient ?Procedure:                Colonoscopy ?Indications:              Abnormal CT of the GI tract, Follow-up of colitis ?Providers:                Elon Alas. Abbey Chatters, DO, Charlsie Quest. Theda Sers RN, Therapist, sports,  ?                          Casimer Bilis, Technician ?Referring MD:              ?Medicines:                See the Anesthesia note for documentation of the  ?                          administered medications ?Complications:            No immediate complications. ?Estimated Blood Loss:     Estimated blood loss was minimal. ?Procedure:                Pre-Anesthesia Assessment: ?                          - The anesthesia plan was to use monitored  ?                          anesthesia care (MAC). ?                          After obtaining informed consent, the colonoscope  ?                          was passed under direct vision. Throughout the  ?                          procedure, the patient's blood pressure, pulse, and  ?                          oxygen saturations were monitored continuously. The  ?                          PCF-HQ190L ([id number]) scope was introduced through  ?                          the anus and advanced to the the terminal ileum,  ?                          with identification of the appendiceal orifice and  ?                          IC valve. The colonoscopy was performed without  ?  difficulty. The patient tolerated the procedure  ?                          well. The quality of the bowel preparation was  ?                          evaluated using the BBPS [facility] Bowel Preparation  ?                          Scale) with scores of: Right Colon = 3, Transverse  ?                          Colon = 3 and Left Colon = 3 (entire mucosa seen  ?                           well with no residual staining, small fragments of  ?                          stool or opaque liquid). The total BBPS score  ?                          equals 9. ?Scope In: 8:04:56 AM ?Scope Out: 8:18:18 AM ?Scope Withdrawal Time: 0 hours 11 minutes 35 seconds  ?Total Procedure Duration: 0 hours 13 minutes 22 seconds  ?Findings: ?     The perianal and digital rectal examinations were normal. ?     Non-bleeding internal hemorrhoids were found during endoscopy. ?     Scattered small-mouthed diverticula were found in the descending colon,  ?     transverse colon and ascending colon. ?     A 4 mm polyp was found in the descending colon. The polyp was sessile.  ?     The polyp was removed with a cold snare. Resection and retrieval were  ?     complete. ?     The terminal ileum appeared normal. ?     The exam was otherwise without abnormality. ?Impression:               - Non-bleeding internal hemorrhoids. ?                          - Diverticulosis in the descending colon, in the  ?                          transverse colon and in the ascending colon. ?                          - One 4 mm polyp in the descending colon, removed  ?                          with a cold snare. Resected and retrieved. ?                          - The examined portion of the ileum was normal. ?                          -  The examination was otherwise normal. ?Moderate Sedation: ?     Per Anesthesia Care ?Recommendation:           - Patient has a contact number available for  ?                          emergencies. The signs and symptoms of potential  ?                          delayed complications were discussed with the  ?                          patient. Return to normal activities tomorrow.  ?                          Written discharge instructions were provided to the  ?                          patient. ?                          - Resume previous diet. ?                          - Continue present medications. ?                           - Await pathology results. ?                          - Repeat colonoscopy in 5 years for surveillance. ?                          - Return to GI clinic PRN. ?Procedure Code(s):        --- Professional --- ?                          [phone], Colonoscopy, flexible; with removal of  ?                          tumor(s), polyp(s), or other lesion(s) by snare  ?                          technique ?Diagnosis Code(s):        --- Professional --- ?                          F09.3, Other hemorrhoids ?                          K63.5, Polyp of colon ?                          K52.9, Noninfective gastroenteritis and colitis,  ?                          unspecified ?  K57.30, Diverticulosis of large intestine without  ?                          perforation or abscess without bleeding ?                          R93.3, Abnormal findings on diagnostic imaging of  ?                          other parts of digestive tract ?CPT copyright 2019 American Medical Association. All rights reserved. ?The codes documented in this report are preliminary and upon coder review may  ?be revised to meet current compliance requirements. ?Elon Alas. Abbey Chatters, DO ?Elon Alas. [place], DO ?11/19/2021 8:21:23 AM ?This report has been signed electronically. ?Number of Addenda: 0 ?

## 2021-11-19 NOTE — Anesthesia Postprocedure Evaluation (Signed)
Anesthesia Post Note ? ?Patient: Timothy Townsend ? ?Procedure(s) Performed: COLONOSCOPY WITH PROPOFOL ?POLYPECTOMY ? ?Patient location during evaluation: Endoscopy ?Anesthesia Type: General ?Level of consciousness: awake and alert and oriented ?Pain management: pain level controlled ?Vital Signs Assessment: post-procedure vital signs reviewed and stable ?Respiratory status: spontaneous breathing, nonlabored ventilation and respiratory function stable ?Cardiovascular status: blood pressure returned to baseline and stable ?Postop Assessment: no apparent nausea or vomiting ?Anesthetic complications: no ? ? ?No notable events documented. ? ? ?Last Vitals:  ?Vitals:  ? 11/19/21 0724 11/19/21 0822  ?BP: 133/85 93/62  ?Pulse: 70 72  ?Resp: 18 18  ?Temp: 36.6 ?C 36.5 ?C  ?SpO2: 96% 95%  ?  ?Last Pain:  ?Vitals:  ? 11/19/21 0822  ?TempSrc: Oral  ?PainSc: 0-No pain  ? ? ?  ?  ?  ?  ?  ?  ? ?Aryia Delira C Karo Rog ? ? ? ? ?

## 2021-11-19 NOTE — Anesthesia Preprocedure Evaluation (Addendum)
Anesthesia Evaluation  ?Patient identified by MRN, date of birth, ID band ?Patient awake ? ? ? ?Reviewed: ?Allergy & Precautions, NPO status , Patient's Chart, lab work & pertinent test results ? ?Airway ?Mallampati: II ? ?TM Distance: >3 FB ?Neck ROM: Full ? ? ? Dental ? ?(+) Dental Advisory Given, Upper Dentures ?  ?Pulmonary ?neg pulmonary ROS,  ?  ?Pulmonary exam normal ?breath sounds clear to auscultation ? ? ? ? ? ? Cardiovascular ?Exercise Tolerance: Good ?hypertension, Pt. on medications ?Normal cardiovascular exam ?Rhythm:Regular Rate:Normal ? ? ?  ?Neuro/Psych ?negative neurological ROS ? negative psych ROS  ? GI/Hepatic ?negative GI ROS, Neg liver ROS,   ?Endo/Other  ?diabetes, Well Controlled, Type 2, Oral Hypoglycemic Agents ? Renal/GU ?negative Renal ROS  ?negative genitourinary ?  ?Musculoskeletal ?negative musculoskeletal ROS ?(+)  ? Abdominal ?  ?Peds ?negative pediatric ROS ?(+)  Hematology ?negative hematology ROS ?(+)   ?Anesthesia Other Findings ? ? Reproductive/Obstetrics ?negative OB ROS ? ?  ? ? ? ? ? ? ? ? ? ? ? ? ? ?  ?  ? ? ? ? ? ? ?Anesthesia Physical ?Anesthesia Plan ? ?ASA: 2 ? ?Anesthesia Plan: General  ? ?Post-op Pain Management: Minimal or no pain anticipated  ? ?Induction: Intravenous ? ?PONV Risk Score and Plan: Propofol infusion ? ?Airway Management Planned: Nasal Cannula and Natural Airway ? ?Additional Equipment:  ? ?Intra-op Plan:  ? ?Post-operative Plan:  ? ?Informed Consent: I have reviewed the patients History and Physical, chart, labs and discussed the procedure including the risks, benefits and alternatives for the proposed anesthesia with the patient or authorized representative who has indicated his/her understanding and acceptance.  ? ? ? ?Dental advisory given ? ?Plan Discussed with: CRNA and Surgeon ? ?Anesthesia Plan Comments:   ? ? ? ? ? ?Anesthesia Quick Evaluation ? ?

## 2021-11-23 LAB — SURGICAL PATHOLOGY

## 2021-11-24 ENCOUNTER — Encounter (HOSPITAL_COMMUNITY): Payer: Self-pay | Admitting: Internal Medicine

## 2021-11-26 DIAGNOSIS — E119 Type 2 diabetes mellitus without complications: Secondary | ICD-10-CM | POA: Diagnosis not present

## 2021-11-26 DIAGNOSIS — Z6833 Body mass index (BMI) 33.0-33.9, adult: Secondary | ICD-10-CM | POA: Diagnosis not present

## 2021-11-26 DIAGNOSIS — E291 Testicular hypofunction: Secondary | ICD-10-CM | POA: Diagnosis not present

## 2021-11-26 DIAGNOSIS — E118 Type 2 diabetes mellitus with unspecified complications: Secondary | ICD-10-CM | POA: Diagnosis not present

## 2021-11-26 DIAGNOSIS — E782 Mixed hyperlipidemia: Secondary | ICD-10-CM | POA: Diagnosis not present

## 2021-11-26 DIAGNOSIS — I10 Essential (primary) hypertension: Secondary | ICD-10-CM | POA: Diagnosis not present

## 2021-11-26 DIAGNOSIS — E6609 Other obesity due to excess calories: Secondary | ICD-10-CM | POA: Diagnosis not present

## 2021-11-26 NOTE — Progress Notes (Signed)
5 de mayo de 2023 ? ? ?[facility] ?[address] ?[place] [id number] ? ? ? ?Estimado se?or Lindsay, ?Los p?lipos extra?dos durante la colonoscopia eran benignos. Repetiremos su pr?xima colonoscopia en 10 a?os con fines de detecci?n. De lo contrario, haga un seguimiento con GI seg?n sea necesario. ? ? ?Si tiene alguna pregunta o inquietud, llame al [phone] y hable con la enfermera. ? ? ? ?[place], ?[place], [place] ?

## 2021-12-09 ENCOUNTER — Encounter: Payer: Self-pay | Admitting: Gastroenterology

## 2021-12-09 ENCOUNTER — Ambulatory Visit: Payer: BC Managed Care – PPO | Admitting: Gastroenterology

## 2021-12-09 VITALS — BP 123/76 | HR 89 | Temp 98.1°F | Ht 65.0 in | Wt 205.4 lb

## 2021-12-09 DIAGNOSIS — K641 Second degree hemorrhoids: Secondary | ICD-10-CM | POA: Diagnosis not present

## 2021-12-09 NOTE — Patient Instructions (Signed)
  Comienza a tomar Benefiber 2 cucharaditas en la bebida de tu eleccin. Puede tomar una tapa de Miralax con 8 onzas de agua segn sea necesario diariamente si siente que no est teniendo una buena evacuacin intestinal.  Te ver de regreso en 2 semanas para ms bandas!  Evite esforzarse y sentarse en el inodoro ms de 2-3 minutos.  Fue un Arboriculturist. Elfredia Nevins crear relaciones de confianza con los pacientes para brindar atencin [place], [place] y de calidad. Valoro tus comentarios. Si recibe [employer] su visita, le agradezco mucho que se tome el tiempo para completarla.  Dra. Gelene Mink, ANP-BC [facility] [place]      Start taking Benefiber 2 teaspoons in the drink of your choice. You can take one capful of Miralax with 8 ounces of water as needed daily if you feel like you are not having a good bowel movement.  I will see you back in 2 weeks for more banding!  Avoid straining and sitting on toilet more than 2-3 minutes.  It was a pleasure to see you today. I want to create trusting relationships with patients to provide genuine, compassionate, and quality care. I value your feedback. If you receive a survey regarding your visit,  I greatly appreciate you taking time to fill this out.   Gelene Mink, PhD, ANP-BC [facility]

## 2021-12-09 NOTE — Progress Notes (Signed)
Non-bleeding internal hemorrhoids.                           - Diverticulosis in the descending colon, in the                            transverse colon and in the ascending colon.                           - One 4 mm polyp in the descending colon, removed                            with a cold snare. Resected and retrieved.                           - The examined portion of the ileum was normal.                           - The examination was otherwise normal.     [facility] BANDING PROCEDURE NOTE  Timothy Townsend is a 61 y.o. male presenting today for consideration of hemorrhoid banding. Last colonoscopy April 2023 with internal hemorrhoids, diverticulosis, polyp removed. He has bleeding, itching, soiling. Interpreter and daughter present today.    The patient presents with symptomatic grade 2 hemorrhoids, unresponsive to maximal medical therapy, requesting rubber band ligation of his hemorrhoidal disease. All risks, benefits, and alternative forms of therapy were described and informed consent was obtained.   The decision was made to band the left lateral internal hemorrhoid, and the [facility] O'Regan System was used to perform band ligation without complication. Digital anorectal examination was then performed to assure proper positioning of the band, and to adjust the banded tissue as required. The patient was discharged home without pain or other issues. Dietary and behavioral recommendations were given, along with follow-up instructions. The patient will return in several weeks for followup and possible additional banding as required.  No complications were encountered and the patient tolerated the procedure well.   Gelene Mink, PhD, ANP-BC [facility]enter[facility]

## 2021-12-30 ENCOUNTER — Encounter: Payer: Self-pay | Admitting: Gastroenterology

## 2021-12-30 ENCOUNTER — Ambulatory Visit: Payer: BC Managed Care – PPO | Admitting: Gastroenterology

## 2021-12-30 VITALS — BP 140/80 | HR 83 | Temp 97.3°F | Ht 68.5 in | Wt 205.0 lb

## 2021-12-30 DIAGNOSIS — K641 Second degree hemorrhoids: Secondary | ICD-10-CM | POA: Diagnosis not present

## 2021-12-30 MED ORDER — HYDROCORTISONE (PERIANAL) 2.5 % EX CREA: 1.0000 "application " | TOPICAL_CREAM | Freq: Two times a day (BID) | CUTANEOUS | 1 refills | Status: AC

## 2021-12-30 NOTE — Progress Notes (Signed)
    [facility] BANDING PROCEDURE NOTE  Timothy Townsend is a 61 y.o. male presenting today for consideration of hemorrhoid banding. Last colonoscopy April 2023 with internal hemorrhoids, diverticulosis, polyp removed. He has bleeding, itching, soiling. Interpreter and daughter present today. Itching and burning noted at times. No pain. Bleeding has improved. Requesting Anusol. Left lateral completed at last appt.     The patient presents with symptomatic grade 2 hemorrhoids, unresponsive to maximal medical therapy, requesting rubber band ligation of his hemorrhoidal disease. All risks, benefits, and alternative forms of therapy were described and informed consent was obtained.  The decision was made to band the right posterior internal hemorrhoid, and the [facility] O'Regan System was used to perform band ligation without complication. Digital anorectal examination was then performed to assure proper positioning of the band, and to adjust the banded tissue as required. The patient was discharged home without pain or other issues. Dietary and behavioral recommendations were given, along with follow-up instructions. The patient will return in several weeks for followup and possible additional banding as required. Anusol cream sent to pharmacy.   No complications were encountered and the patient tolerated the procedure well.   Gelene Mink, PhD, ANP-BC [facility]enter[facility]

## 2021-12-30 NOTE — Patient Instructions (Signed)
I have sent in a cream to use on your rectum twice a day.  We will see you back in 2 weeks for more banding!  I enjoyed seeing you again today! As you know, I value our relationship and want to provide genuine, compassionate, and quality care. I welcome your feedback. If you receive a survey regarding your visit,  I greatly appreciate you taking time to fill this out. See you next time!  Gelene Mink, PhD, ANP-BC [facility]astroenter[facility]

## 2022-01-11 DIAGNOSIS — E291 Testicular hypofunction: Secondary | ICD-10-CM | POA: Diagnosis not present

## 2022-01-13 ENCOUNTER — Encounter: Payer: BC Managed Care – PPO | Admitting: Gastroenterology

## 2022-01-20 ENCOUNTER — Ambulatory Visit: Payer: BC Managed Care – PPO | Admitting: Gastroenterology

## 2022-01-20 ENCOUNTER — Encounter: Payer: Self-pay | Admitting: Gastroenterology

## 2022-01-20 VITALS — BP 128/81 | HR 87 | Temp 97.3°F | Ht 66.0 in | Wt 207.8 lb

## 2022-01-20 DIAGNOSIS — K641 Second degree hemorrhoids: Secondary | ICD-10-CM | POA: Diagnosis not present

## 2022-01-20 NOTE — Progress Notes (Signed)
    [facility] BANDING PROCEDURE NOTE  Timothy Townsend is a 61 y.o. male presenting today for consideration of hemorrhoid banding. Last colonoscopy April 2023 with internal hemorrhoids, diverticulosis, polyp removed. He has bleeding, itching, soiling. Interpreter and daughter present today. He has had left lateral and right posterior completed. Only symptom is itching/burning at times.    The patient presents with symptomatic grade 2 hemorrhoids, unresponsive to maximal medical therapy, requesting rubber band ligation of his hemorrhoidal disease. All risks, benefits, and alternative forms of therapy were described and informed consent was obtained.  The decision was made to band the right anterior internal hemorrhoid, and the [facility] O'Regan System was used to perform band ligation without complication. Digital anorectal examination was then performed to assure proper positioning of the band, and to adjust the banded tissue as required. The patient was discharged home without pain or other issues. Dietary and behavioral recommendations were given, along with follow-up instructions. The patient will return in several weeks for followup and possible additional banding as required.  No complications were encountered and the patient tolerated the procedure well.   Gelene Mink, PhD, ANP-BC [facility]astroenterology

## 2022-01-20 NOTE — Patient Instructions (Signed)
Please continue to avoid straining.  You should limit your toilet time to 2-3 minutes at the most.   Continue to avoid constipation.  Please call me with any concerns or issues!  We will see you as needed!  I enjoyed seeing you again today! As you know, I value our relationship and want to provide genuine, compassionate, and quality care. I welcome your feedback. If you receive a survey regarding your visit,  I greatly appreciate you taking time to fill this out. See you next time!  Nicklaus Alviar W. Tangia Pinard, PhD, ANP-BC [facility]           

## 2022-02-18 DIAGNOSIS — E291 Testicular hypofunction: Secondary | ICD-10-CM | POA: Diagnosis not present

## 2022-03-16 DIAGNOSIS — E291 Testicular hypofunction: Secondary | ICD-10-CM | POA: Diagnosis not present

## 2022-07-22 DIAGNOSIS — E6609 Other obesity due to excess calories: Secondary | ICD-10-CM | POA: Diagnosis not present

## 2022-07-22 DIAGNOSIS — E7849 Other hyperlipidemia: Secondary | ICD-10-CM | POA: Diagnosis not present

## 2022-07-22 DIAGNOSIS — E119 Type 2 diabetes mellitus without complications: Secondary | ICD-10-CM | POA: Diagnosis not present

## 2022-07-22 DIAGNOSIS — E291 Testicular hypofunction: Secondary | ICD-10-CM | POA: Diagnosis not present

## 2022-07-22 DIAGNOSIS — Z6834 Body mass index (BMI) 34.0-34.9, adult: Secondary | ICD-10-CM | POA: Diagnosis not present

## 2022-07-22 DIAGNOSIS — Z0001 Encounter for general adult medical examination with abnormal findings: Secondary | ICD-10-CM | POA: Diagnosis not present

## 2022-07-22 DIAGNOSIS — H8110 Benign paroxysmal vertigo, unspecified ear: Secondary | ICD-10-CM | POA: Diagnosis not present

## 2022-07-22 DIAGNOSIS — E782 Mixed hyperlipidemia: Secondary | ICD-10-CM | POA: Diagnosis not present

## 2022-07-22 DIAGNOSIS — E118 Type 2 diabetes mellitus with unspecified complications: Secondary | ICD-10-CM | POA: Diagnosis not present

## 2022-07-22 DIAGNOSIS — Z1331 Encounter for screening for depression: Secondary | ICD-10-CM | POA: Diagnosis not present

## 2022-07-22 DIAGNOSIS — I10 Essential (primary) hypertension: Secondary | ICD-10-CM | POA: Diagnosis not present

## 2022-08-22 DIAGNOSIS — E291 Testicular hypofunction: Secondary | ICD-10-CM | POA: Diagnosis not present

## 2022-09-22 DIAGNOSIS — E291 Testicular hypofunction: Secondary | ICD-10-CM | POA: Diagnosis not present

## 2022-10-19 DIAGNOSIS — E291 Testicular hypofunction: Secondary | ICD-10-CM | POA: Diagnosis not present

## 2022-11-18 DIAGNOSIS — E291 Testicular hypofunction: Secondary | ICD-10-CM | POA: Diagnosis not present

## 2022-12-26 DIAGNOSIS — E291 Testicular hypofunction: Secondary | ICD-10-CM | POA: Diagnosis not present

## 2023-01-18 DIAGNOSIS — E291 Testicular hypofunction: Secondary | ICD-10-CM | POA: Diagnosis not present

## 2023-02-03 DIAGNOSIS — E6609 Other obesity due to excess calories: Secondary | ICD-10-CM | POA: Diagnosis not present

## 2023-02-03 DIAGNOSIS — E291 Testicular hypofunction: Secondary | ICD-10-CM | POA: Diagnosis not present

## 2023-02-03 DIAGNOSIS — H8110 Benign paroxysmal vertigo, unspecified ear: Secondary | ICD-10-CM | POA: Diagnosis not present

## 2023-02-03 DIAGNOSIS — I10 Essential (primary) hypertension: Secondary | ICD-10-CM | POA: Diagnosis not present

## 2023-02-03 DIAGNOSIS — E1159 Type 2 diabetes mellitus with other circulatory complications: Secondary | ICD-10-CM | POA: Diagnosis not present

## 2023-02-03 DIAGNOSIS — Z6834 Body mass index (BMI) 34.0-34.9, adult: Secondary | ICD-10-CM | POA: Diagnosis not present

## 2023-02-03 DIAGNOSIS — E782 Mixed hyperlipidemia: Secondary | ICD-10-CM | POA: Diagnosis not present

## 2023-03-14 DIAGNOSIS — E291 Testicular hypofunction: Secondary | ICD-10-CM | POA: Diagnosis not present

## 2023-04-24 DIAGNOSIS — E291 Testicular hypofunction: Secondary | ICD-10-CM | POA: Diagnosis not present

## 2023-06-05 DIAGNOSIS — E291 Testicular hypofunction: Secondary | ICD-10-CM | POA: Diagnosis not present

## 2023-07-03 DIAGNOSIS — E291 Testicular hypofunction: Secondary | ICD-10-CM | POA: Diagnosis not present

## 2023-08-04 DIAGNOSIS — E291 Testicular hypofunction: Secondary | ICD-10-CM | POA: Diagnosis not present

## 2023-08-17 DIAGNOSIS — E7849 Other hyperlipidemia: Secondary | ICD-10-CM | POA: Diagnosis not present

## 2023-08-17 DIAGNOSIS — Z6835 Body mass index (BMI) 35.0-35.9, adult: Secondary | ICD-10-CM | POA: Diagnosis not present

## 2023-08-17 DIAGNOSIS — E1159 Type 2 diabetes mellitus with other circulatory complications: Secondary | ICD-10-CM | POA: Diagnosis not present

## 2023-08-17 DIAGNOSIS — E6609 Other obesity due to excess calories: Secondary | ICD-10-CM | POA: Diagnosis not present

## 2023-10-13 DIAGNOSIS — E291 Testicular hypofunction: Secondary | ICD-10-CM | POA: Diagnosis not present

## 2024-02-23 DIAGNOSIS — E6609 Other obesity due to excess calories: Secondary | ICD-10-CM | POA: Diagnosis not present

## 2024-02-23 DIAGNOSIS — E1159 Type 2 diabetes mellitus with other circulatory complications: Secondary | ICD-10-CM | POA: Diagnosis not present

## 2024-02-23 DIAGNOSIS — E782 Mixed hyperlipidemia: Secondary | ICD-10-CM | POA: Diagnosis not present

## 2024-02-23 DIAGNOSIS — D2372 Other benign neoplasm of skin of left lower limb, including hip: Secondary | ICD-10-CM | POA: Diagnosis not present

## 2024-02-23 DIAGNOSIS — I10 Essential (primary) hypertension: Secondary | ICD-10-CM | POA: Diagnosis not present

## 2024-02-23 DIAGNOSIS — E291 Testicular hypofunction: Secondary | ICD-10-CM | POA: Diagnosis not present

## 2024-02-23 DIAGNOSIS — Z6834 Body mass index (BMI) 34.0-34.9, adult: Secondary | ICD-10-CM | POA: Diagnosis not present

## 2024-03-12 IMAGING — CT CT ABD-PELV W/ CM
2 of 5 series · 15 of 46 positions shown, 17 images · IV contrast (Omnipaque or Isovue)
Comparison: None.

CLINICAL DATA: RLQ abdominal pain (Age >= 14y) fever, R sided
abdominal pain

Patient also reports left upper quadrant pain
EXAM:
CT ABDOMEN AND PELVIS WITH CONTRAST
TECHNIQUE: Multidetector CT imaging of the abdomen and pelvis was performed
using the standard protocol following bolus administration of
intravenous contrast.

[Series 2: axial st · axial · 0.81mm/px · z∈[+1300,+1735]mm · 12 of 99 slices shown, 14 images]
[im 6/99  soft-tissue]
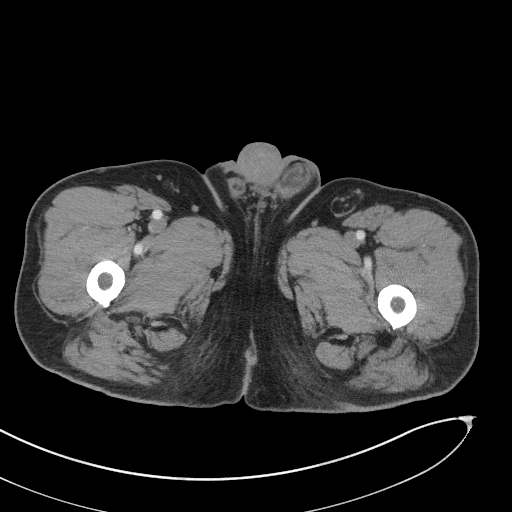
[im 6/99  bone]
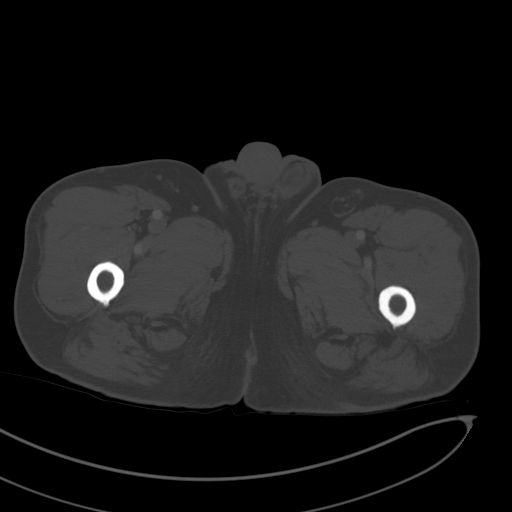
[im 17/99  soft-tissue]
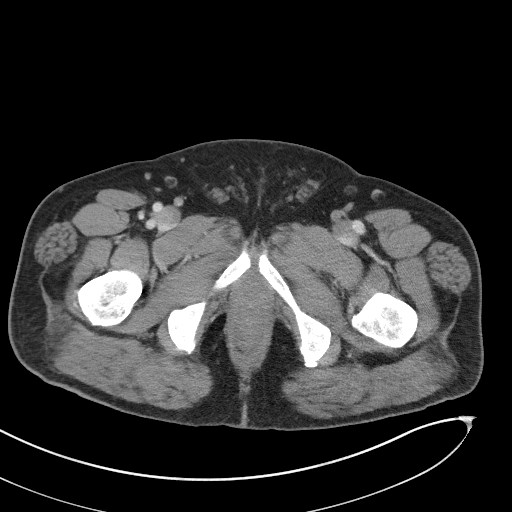
[im 22/99  soft-tissue]
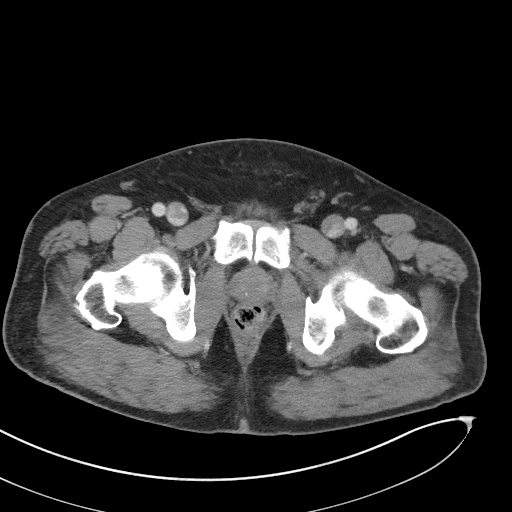
[im 28/99  soft-tissue]
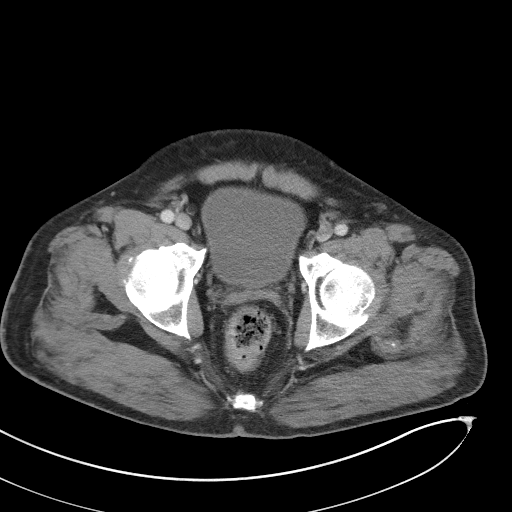
[im 39/99  soft-tissue]
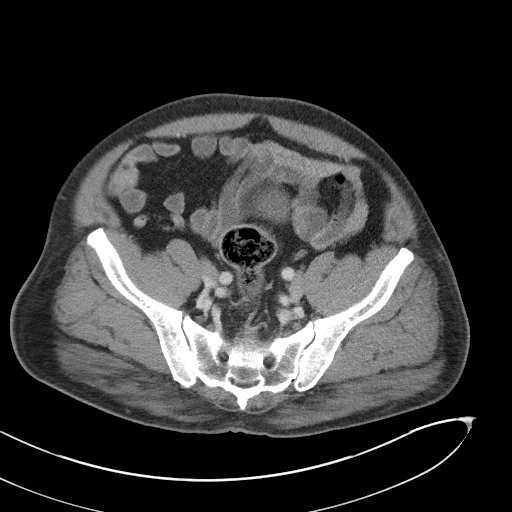
[im 44/99  soft-tissue]
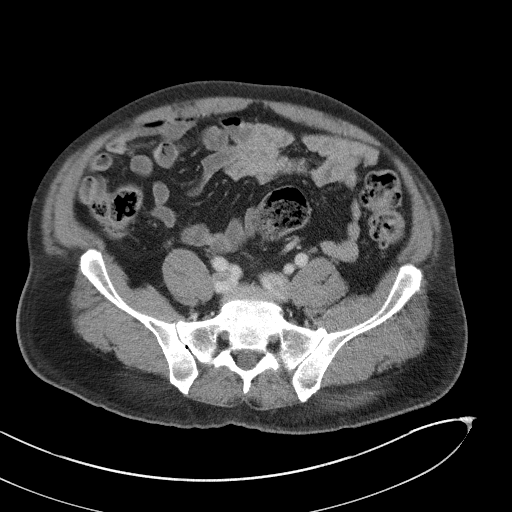
[im 55/99  soft-tissue]
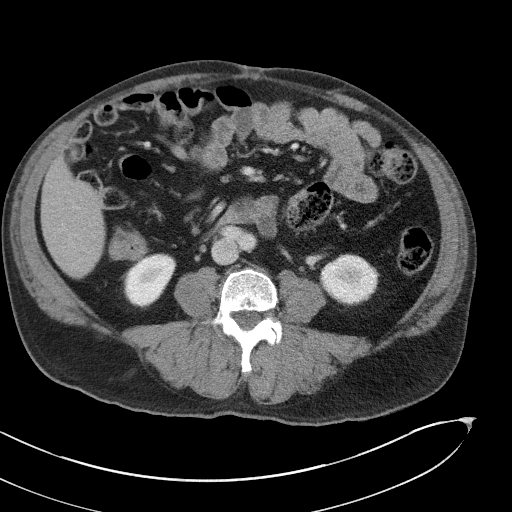
[im 60/99  soft-tissue]
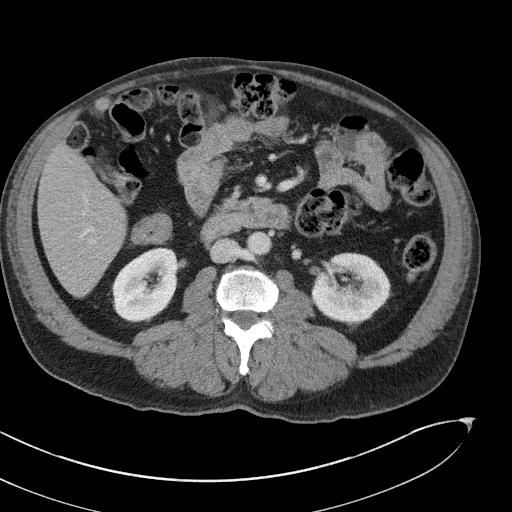
[im 71/99  soft-tissue]
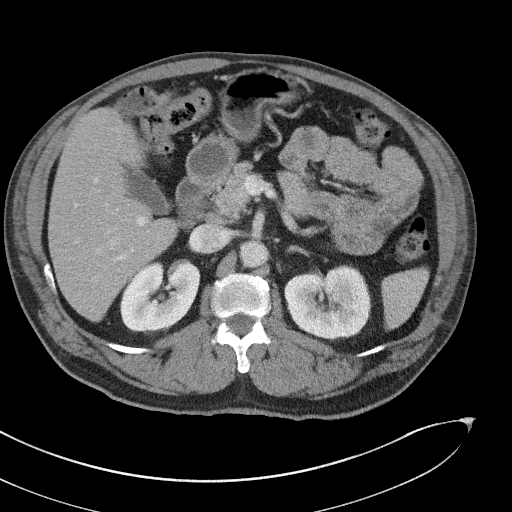
[im 71/99  bone]
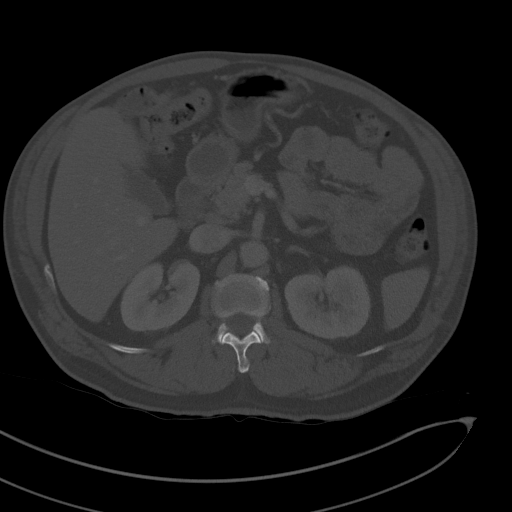
[im 77/99  soft-tissue]
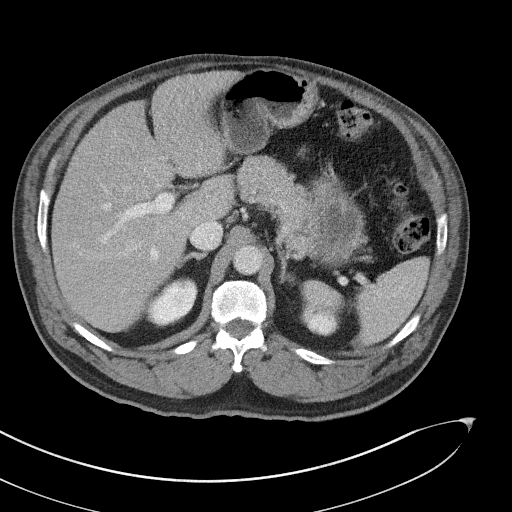
[im 82/99  soft-tissue]
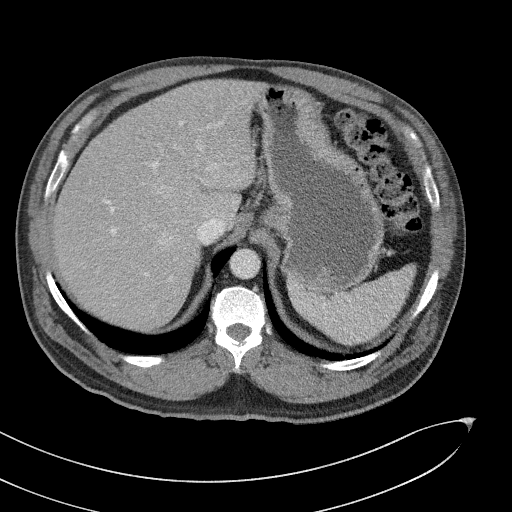
[im 93/99  soft-tissue]
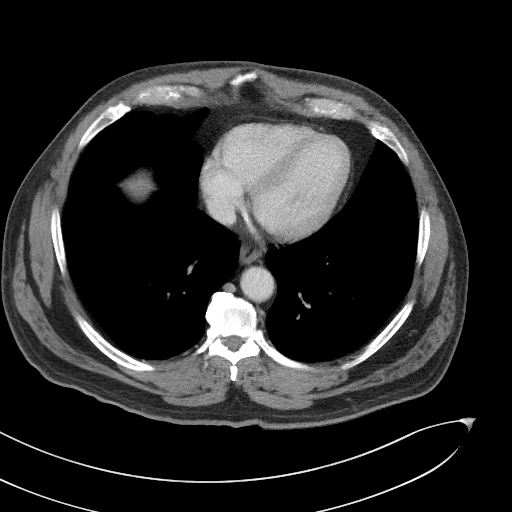

[Series 5: coronal st · coronal · 0.87mm/px · 3 of 118 slices shown]
[im 40/118  soft-tissue]
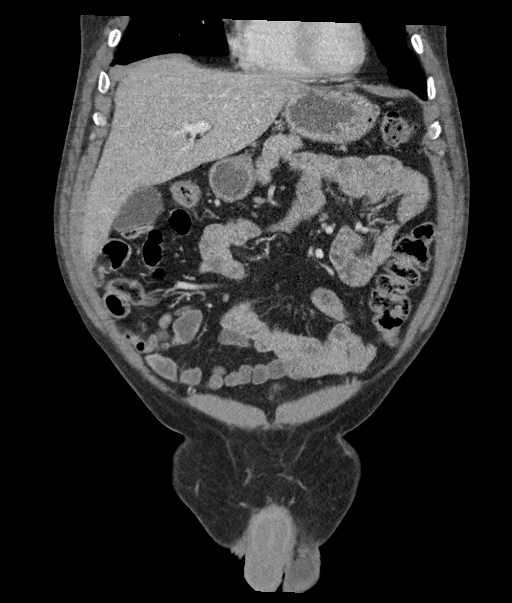
[im 53/118  soft-tissue]
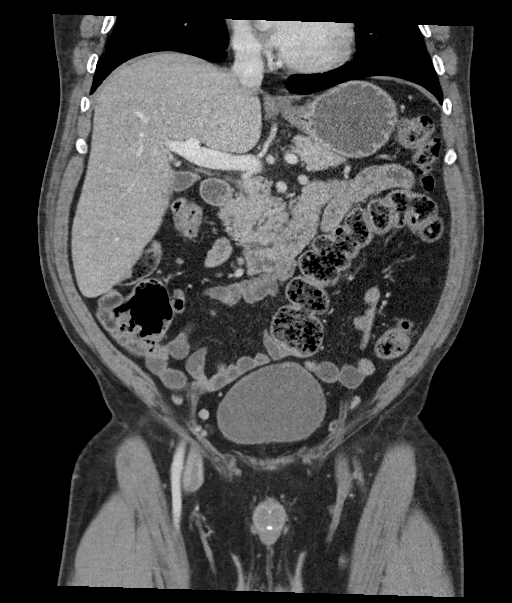
[im 66/118  soft-tissue]
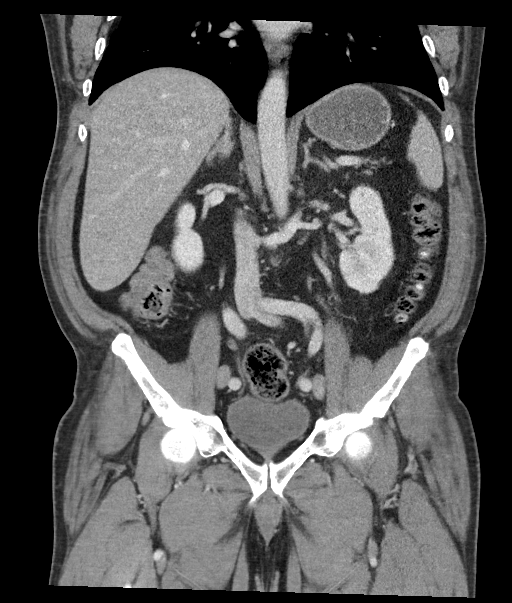

[15 of 46 positions shown; findings below may reference images not displayed]

RADIATION DOSE REDUCTION: This exam was performed according to the
departmental dose-optimization program which includes automated
exposure control, adjustment of the mA and/or kV according to
patient size and/or use of iterative reconstruction technique.

CONTRAST:  100mL OMNIPAQUE IOHEXOL 300 MG/ML  SOLN
FINDINGS: Lower chest: No acute airspace disease or pleural effusion.

Hepatobiliary: Diffusely decreased hepatic density consistent with
steatosis. The liver is enlarged spanning 21.8 cm cranial caudal no
calcified gallstone or gallbladder inflammation no biliary
dilatation.

Pancreas: No ductal dilatation or inflammation. No evidence of
pancreatic mass.

Spleen: Normal in size without focal abnormality.

Adrenals/Urinary Tract: Normal adrenal glands. No hydronephrosis or
perinephric edema. Homogeneous renal enhancement with symmetric
excretion on delayed phase imaging. No urolithiasis. Small cysts
within both kidneys urinary bladder is physiologically distended
without wall thickening.

Stomach/Bowel: Normal appendix, series 2, image 62. The stomach is
distended with fluid/ingested material. Tiny duodenal diverticulum.
No small bowel obstruction or inflammation. There is mild
fecalization of distal small bowel contents. Moderate volume of
colonic stool. Equivocal wall thickening of the ascending and
hepatic flexure of the colon versus nondistention, although no
pericolonic edema. The sigmoid colon is redundant. Mild descending
colonic diverticulosis without diverticulitis.

Vascular/Lymphatic: Normal caliber abdominal aorta. Patent portal,
mesenteric and splenic veins. Circumaortic left renal vein. No
abdominopelvic adenopathy.

Reproductive: Prostate is unremarkable.

Other: No free air, free fluid, or intra-abdominal fluid collection.
Tiny fat containing umbilical hernia.

Musculoskeletal: Mild lumbar spondylosis with spurring. There are no
acute or suspicious osseous abnormalities. Tiny scattered bone
islands in the pelvis.
IMPRESSION: 1. Equivocal wall thickening of the ascending and hepatic flexure of
the colon versus nondistention, although no pericolonic edema. This
could be seen with mild colitis.
2. Normal appendix.
3. Hepatomegaly and hepatic steatosis.
4. Mild descending colonic diverticulosis without diverticulitis.
5. Moderate colonic stool burden with colonic tortuosity and
fecalized small bowel contents, suggestive of slow transit.

## 2024-06-03 DIAGNOSIS — E1169 Type 2 diabetes mellitus with other specified complication: Secondary | ICD-10-CM | POA: Diagnosis not present

## 2024-06-03 DIAGNOSIS — E782 Mixed hyperlipidemia: Secondary | ICD-10-CM | POA: Diagnosis not present

## 2024-06-03 DIAGNOSIS — E291 Testicular hypofunction: Secondary | ICD-10-CM | POA: Diagnosis not present
